# Patient Record
Sex: Female | Born: 1940 | Race: White | Hispanic: No | Marital: Married | Smoking: Former smoker
Health system: Southern US, Community
[De-identification: ages and names within clinical notes are randomized; demographics above are authoritative.]

## PROBLEM LIST (undated history)

## (undated) DIAGNOSIS — I509 Heart failure, unspecified: Secondary | ICD-10-CM

## (undated) DIAGNOSIS — I639 Cerebral infarction, unspecified: Secondary | ICD-10-CM

## (undated) DIAGNOSIS — G931 Anoxic brain damage, not elsewhere classified: Secondary | ICD-10-CM

## (undated) DIAGNOSIS — I214 Non-ST elevation (NSTEMI) myocardial infarction: Secondary | ICD-10-CM

## (undated) DIAGNOSIS — I10 Essential (primary) hypertension: Secondary | ICD-10-CM

## (undated) DIAGNOSIS — E7800 Pure hypercholesterolemia, unspecified: Secondary | ICD-10-CM

## (undated) DIAGNOSIS — E119 Type 2 diabetes mellitus without complications: Secondary | ICD-10-CM

## (undated) DIAGNOSIS — N289 Disorder of kidney and ureter, unspecified: Secondary | ICD-10-CM

## (undated) HISTORY — PX: ABDOMINAL HYSTERECTOMY: SHX81

## (undated) HISTORY — DX: Essential (primary) hypertension: I10

## (undated) HISTORY — DX: Disorder of kidney and ureter, unspecified: N28.9

## (undated) HISTORY — DX: Anoxic brain damage, not elsewhere classified: G93.1

## (undated) HISTORY — DX: Heart failure, unspecified: I50.9

## (undated) HISTORY — DX: Cerebral infarction, unspecified: I63.9

---

## 2008-08-25 ENCOUNTER — Emergency Department (HOSPITAL_COMMUNITY): Admission: EM | Admit: 2008-08-25 | Discharge: 2008-08-26 | Payer: Self-pay | Admitting: Emergency Medicine

## 2010-04-09 LAB — URINALYSIS, ROUTINE W REFLEX MICROSCOPIC
Bilirubin Urine: NEGATIVE
Hgb urine dipstick: NEGATIVE
Ketones, ur: NEGATIVE mg/dL
Nitrite: NEGATIVE
Protein, ur: >300 mg/dL — AB
Specific Gravity, Urine: 1.009 (ref 1.005–1.030)
Urobilinogen, UA: 0.2 mg/dL (ref 0.0–1.0)

## 2010-04-09 LAB — CBC
HCT: 34.6 % — ABNORMAL LOW (ref 36.0–46.0)
Hemoglobin: 11.6 g/dL — ABNORMAL LOW (ref 12.0–15.0)
MCHC: 33.7 g/dL (ref 30.0–36.0)
MCV: 90.5 fL (ref 78.0–100.0)
RBC: 3.82 MIL/uL — ABNORMAL LOW (ref 3.87–5.11)
RDW: 14.6 % (ref 11.5–15.5)

## 2010-04-09 LAB — DIFFERENTIAL
Basophils Absolute: 0.1 10*3/uL (ref 0.0–0.1)
Basophils Relative: 1 % (ref 0–1)
Eosinophils Absolute: 0.3 10*3/uL (ref 0.0–0.7)
Eosinophils Relative: 4 % (ref 0–5)
Monocytes Absolute: 0.5 10*3/uL (ref 0.1–1.0)
Monocytes Relative: 6 % (ref 3–12)
Neutro Abs: 4.5 10*3/uL (ref 1.7–7.7)

## 2010-04-09 LAB — POCT I-STAT, CHEM 8
BUN: 24 mg/dL — ABNORMAL HIGH (ref 6–23)
Creatinine, Ser: 1.3 mg/dL — ABNORMAL HIGH (ref 0.4–1.2)
Glucose, Bld: 166 mg/dL — ABNORMAL HIGH (ref 70–99)
Hemoglobin: 11.6 g/dL — ABNORMAL LOW (ref 12.0–15.0)
Potassium: 4.5 mEq/L (ref 3.5–5.1)
Sodium: 139 mEq/L (ref 135–145)

## 2010-04-09 LAB — URINE MICROSCOPIC-ADD ON

## 2010-05-17 NOTE — Consult Note (Signed)
NAME:  Rachel Curry, Rachel Curry NO.:  [id number]   MEDICAL RECORD NO.:  [id number]          PATIENT TYPE:  EMS   LOCATION:  [facility]                         FACILITY:  [facility]   PHYSICIAN:  Gaspar Garbe, M.D.DATE OF BIRTH:  September 21, 1940   DATE OF CONSULTATION:  08/26/2008  DATE OF DISCHARGE:  08/26/2008                                 CONSULTATION   CHIEF COMPLAINT:  Left-sided weakness.   HISTORY OF PRESENT ILLNESS:  The patient is a 70 year old white female  who is well known to me from primary care and I have been taking care of  her for the past 8 years.  She has had an anoxic brain injury since 1989  following a cardiac arrest as an left in a relatively childlike state,  goes to adult day care and has taken care of her husband who attends her  at all of her visits.  He has been very supportive of her over the past  20 years and indicates that he has no desire to put her to any nursing  facility whatsoever until his health fails, nothing he cannot take care  of her.  In addition, she has diabetes mellitus, which has been well  controlled for the past 6 years on insulin-based therapy, hypertension,  and hyperlipidemia.   The patient's husband dropped off a urine sample at our office  indicating that she had been acting funny over the course of the weekend  and is usually indicates urinary tract infection, her urine came back  negative and he called again Tuesday mid morning, indicating that she  was still acting little bit funny.  He also has noticed over the past  few days that she had been favoring her right side and not moving her  left side nearly as much.  Her walking and gait changed a little bit.  We instructed them to go over to the emergency room for evaluation as  showing on her childlike state would most likely need some sort of  sedation in order to proceed with imaging or workup of stroke.  The  patient was taken over there late in the evening about 5 p.m.,  it was  made the urgency that was noted to the husband initially.  He underwent  a CAT scan that was negative, urinalysis that was negative and has been  held in CDU overnight with a pending MRI, which was just completed at  the time of this dictation.  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  I was asked to see the patient for consideration of  admission versus outpatient treatment.   CURRENT MEDICATIONS:  1. Aspirin 325 mg once daily.  2. Lantus 85 units subcu at bedtime.  3. Glucophage XR 500 mg daily.  4. Lipitor 20 mg daily.  5. TriCor 145 mg daily.  6. Enalapril 20 mg daily.  7. Prilosec 20 mg daily.  8. Lorazepam 1 mg tablets 2-4 tablets as needed for agitation.  9. Ecotrin 325 mg  daily.  10.NovoLog by sliding scale.  11.70/30 NovoLog Mix 45 units in the mornings.  12.Bactrim 1 tablet every Monday, Wednesday, Friday for urinary tract      infection prophylaxis.   ALLERGIES:  The patient is allergic to [facility].   PAST MEDICAL HISTORY:  1. Diabetes mellitus type 2, last A1c was 6.7.  2. Hypertension.  3. Hypertriglyceridemia, currently on dual therapy.  4. History of coronary artery disease.  5. 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   SOCIAL HISTORY:  The patient is disabled.  She is a nonsmoker,  nondrinker and her husband, Shea Evans, takes care for full-time.   FAMILY HISTORY:  Not well known.   REVIEW OF SYSTEMS:  The patient was unable to give.  Per the husband,  she has strength back on her left side, but her gait has changed to some  extent.  She seems over the past several months to have been leaning a  little bit more towards the left.   PHYSICAL EXAMINATION:  VITAL SIGNS:  Temperature 98.2, pulse 82,  respiratory rate 18, blood pressure 126/72.  GENERAL:  No acute distress.  HEENT:  Normocephalic, atraumatic.  LUNGS:  Clear to auscultation  bilaterally.  HEART:  Regular rate and rhythm.  No murmur, rub, or gallop are  appreciated.  ABDOMEN:  Soft, nontender, normoactive bowel sounds.  NEUROLOGIC:  The patient is in childlike state, does not able to give  verbal understanding.  She will say good-bye and no, which is her  baseline and will nod usually in a negative fashion regardless of the  response.  She is able to move her left side upper body and has very  good grip strength of her left hand, 4/5 strength in her left upper  thigh.   IMAGING:  CAT scan of brain was negative.  MRI shows a right vertex  hemorrhagic stroke, which is small in nature.  There was no intracranial  shift per Radiology.   LABORATORY TESTING:  Urinalysis shows 0-2 white blood cells and rare  bacteria.  The urinalysis is negative for leukocyte nitrate, positive  for protein which is her baseline.  CBC shows a white count of 8.4,  hemoglobin 11.6, hematocrit 34.6, platelet count 254.  Chemistries show  a potassium of 4.5, sodium 139, BUN and creatinine 24 and 1.3  respectively which are her baseline.   ASSESSMENT AND PLAN:  Left-sided vertex cerebrovascular accident.  After  long discussion with the patient's husband, indicates that he does not  want her admitted to the hospital and he prefers to take care of her at  home.  He was otherwise able-bodied.  I get off from physical therapy,  occupational therapy for the patient; however, we know that she will not  be able to comply with a physical therapy regimen as she is in a very  childlike state and has been for the past 20 years.  I encouraged him to  try to exercise her at home and with adult day care as best that they  can.  In the meantime, set of carotid Dopplers on the patient.  She is  not a Coumadin candidate given a history of falls, and dance relative  inability to be able to get her in frequently for Coumadin checks as  needed.  She has seen in the office regularly every 3 months, but  more  than that would be not something that he wanted to do, this was  discussed with the patient's husband  and he agreed this was not the best  option.  We have decided therefore to discontinue her aspirin and start  her on Plavix 75 mg per day, the dose will be given in the emergency  room.  The patient will have  followup for carotid ultrasounds to rule out need for surgical  intervention.  She is not desirable however, this will be arranged for  her in the next 24 hours.  In addition, I will see her back in the  office in 1 week and provide a reassessment.      Gaspar Garbe, M.D.  Electronically Signed     RWT/MEDQ  D:  08/26/2008  T:  08/26/2008  Job:  [id number]

## 2013-05-02 ENCOUNTER — Ambulatory Visit (HOSPITAL_COMMUNITY)
Admission: RE | Admit: 2013-05-02 | Discharge: 2013-05-02 | Disposition: A | Discharge status: Home / Self Care | Payer: Medicare Other | Source: Ambulatory Visit | Attending: Surgery | Admitting: Surgery

## 2013-05-02 ENCOUNTER — Other Ambulatory Visit (HOSPITAL_COMMUNITY): Payer: Self-pay | Admitting: Internal Medicine

## 2013-05-02 DIAGNOSIS — M7989 Other specified soft tissue disorders: Secondary | ICD-10-CM

## 2014-10-04 ENCOUNTER — Emergency Department (HOSPITAL_COMMUNITY): Payer: Medicare Other

## 2014-10-04 ENCOUNTER — Inpatient Hospital Stay (HOSPITAL_COMMUNITY)
Admission: EM | Admit: 2014-10-04 | Discharge: 2014-10-08 | DRG: 291 | Disposition: A | Discharge status: Home Health | Payer: Medicare Other | Attending: Internal Medicine | Admitting: Internal Medicine

## 2014-10-04 ENCOUNTER — Inpatient Hospital Stay (HOSPITAL_COMMUNITY): Payer: Medicare Other

## 2014-10-04 ENCOUNTER — Encounter (HOSPITAL_COMMUNITY): Payer: Self-pay | Admitting: *Deleted

## 2014-10-04 DIAGNOSIS — G4733 Obstructive sleep apnea (adult) (pediatric): Secondary | ICD-10-CM | POA: Diagnosis present

## 2014-10-04 DIAGNOSIS — Z87891 Personal history of nicotine dependence: Secondary | ICD-10-CM | POA: Diagnosis not present

## 2014-10-04 DIAGNOSIS — J810 Acute pulmonary edema: Secondary | ICD-10-CM | POA: Diagnosis not present

## 2014-10-04 DIAGNOSIS — Z8673 Personal history of transient ischemic attack (TIA), and cerebral infarction without residual deficits: Secondary | ICD-10-CM | POA: Diagnosis not present

## 2014-10-04 DIAGNOSIS — J9601 Acute respiratory failure with hypoxia: Secondary | ICD-10-CM | POA: Diagnosis not present

## 2014-10-04 DIAGNOSIS — Z993 Dependence on wheelchair: Secondary | ICD-10-CM

## 2014-10-04 DIAGNOSIS — R29810 Facial weakness: Secondary | ICD-10-CM | POA: Diagnosis not present

## 2014-10-04 DIAGNOSIS — R9431 Abnormal electrocardiogram [ECG] [EKG]: Secondary | ICD-10-CM | POA: Diagnosis not present

## 2014-10-04 DIAGNOSIS — Z888 Allergy status to other drugs, medicaments and biological substances status: Secondary | ICD-10-CM

## 2014-10-04 DIAGNOSIS — I252 Old myocardial infarction: Secondary | ICD-10-CM | POA: Diagnosis not present

## 2014-10-04 DIAGNOSIS — J811 Chronic pulmonary edema: Secondary | ICD-10-CM | POA: Diagnosis present

## 2014-10-04 DIAGNOSIS — N184 Chronic kidney disease, stage 4 (severe): Secondary | ICD-10-CM | POA: Diagnosis present

## 2014-10-04 DIAGNOSIS — E7800 Pure hypercholesterolemia, unspecified: Secondary | ICD-10-CM | POA: Diagnosis present

## 2014-10-04 DIAGNOSIS — Z881 Allergy status to other antibiotic agents status: Secondary | ICD-10-CM | POA: Diagnosis not present

## 2014-10-04 DIAGNOSIS — Z7902 Long term (current) use of antithrombotics/antiplatelets: Secondary | ICD-10-CM | POA: Diagnosis not present

## 2014-10-04 DIAGNOSIS — I130 Hypertensive heart and chronic kidney disease with heart failure and stage 1 through stage 4 chronic kidney disease, or unspecified chronic kidney disease: Secondary | ICD-10-CM | POA: Diagnosis present

## 2014-10-04 DIAGNOSIS — I2510 Atherosclerotic heart disease of native coronary artery without angina pectoris: Secondary | ICD-10-CM | POA: Diagnosis present

## 2014-10-04 DIAGNOSIS — E870 Hyperosmolality and hypernatremia: Secondary | ICD-10-CM | POA: Diagnosis present

## 2014-10-04 DIAGNOSIS — G931 Anoxic brain damage, not elsewhere classified: Secondary | ICD-10-CM | POA: Diagnosis present

## 2014-10-04 DIAGNOSIS — N189 Chronic kidney disease, unspecified: Secondary | ICD-10-CM | POA: Diagnosis not present

## 2014-10-04 DIAGNOSIS — S14123S Central cord syndrome at C3 level of cervical spinal cord, sequela: Secondary | ICD-10-CM

## 2014-10-04 DIAGNOSIS — I5031 Acute diastolic (congestive) heart failure: Secondary | ICD-10-CM

## 2014-10-04 DIAGNOSIS — D631 Anemia in chronic kidney disease: Secondary | ICD-10-CM | POA: Diagnosis present

## 2014-10-04 DIAGNOSIS — J90 Pleural effusion, not elsewhere classified: Secondary | ICD-10-CM | POA: Diagnosis present

## 2014-10-04 DIAGNOSIS — E872 Acidosis: Secondary | ICD-10-CM | POA: Diagnosis present

## 2014-10-04 DIAGNOSIS — E1122 Type 2 diabetes mellitus with diabetic chronic kidney disease: Secondary | ICD-10-CM | POA: Diagnosis present

## 2014-10-04 DIAGNOSIS — Z794 Long term (current) use of insulin: Secondary | ICD-10-CM

## 2014-10-04 DIAGNOSIS — N390 Urinary tract infection, site not specified: Secondary | ICD-10-CM | POA: Diagnosis present

## 2014-10-04 DIAGNOSIS — I5021 Acute systolic (congestive) heart failure: Secondary | ICD-10-CM | POA: Diagnosis present

## 2014-10-04 DIAGNOSIS — E785 Hyperlipidemia, unspecified: Secondary | ICD-10-CM | POA: Diagnosis present

## 2014-10-04 DIAGNOSIS — Z79899 Other long term (current) drug therapy: Secondary | ICD-10-CM | POA: Diagnosis not present

## 2014-10-04 DIAGNOSIS — N179 Acute kidney failure, unspecified: Secondary | ICD-10-CM | POA: Diagnosis present

## 2014-10-04 DIAGNOSIS — I509 Heart failure, unspecified: Secondary | ICD-10-CM | POA: Diagnosis not present

## 2014-10-04 DIAGNOSIS — N170 Acute kidney failure with tubular necrosis: Secondary | ICD-10-CM

## 2014-10-04 DIAGNOSIS — D649 Anemia, unspecified: Secondary | ICD-10-CM

## 2014-10-04 DIAGNOSIS — I10 Essential (primary) hypertension: Secondary | ICD-10-CM | POA: Diagnosis present

## 2014-10-04 DIAGNOSIS — R1310 Dysphagia, unspecified: Secondary | ICD-10-CM

## 2014-10-04 DIAGNOSIS — E871 Hypo-osmolality and hyponatremia: Secondary | ICD-10-CM | POA: Diagnosis present

## 2014-10-04 DIAGNOSIS — S14123A Central cord syndrome at C3 level of cervical spinal cord, initial encounter: Secondary | ICD-10-CM | POA: Diagnosis present

## 2014-10-04 DIAGNOSIS — E1129 Type 2 diabetes mellitus with other diabetic kidney complication: Secondary | ICD-10-CM | POA: Diagnosis present

## 2014-10-04 HISTORY — DX: Pure hypercholesterolemia, unspecified: E78.00

## 2014-10-04 HISTORY — DX: Type 2 diabetes mellitus without complications: E11.9

## 2014-10-04 HISTORY — DX: Non-ST elevation (NSTEMI) myocardial infarction: I21.4

## 2014-10-04 LAB — CBC WITH DIFFERENTIAL/PLATELET
Basophils Absolute: 0 10*3/uL (ref 0.0–0.1)
Basophils Relative: 0 %
EOS PCT: 3 %
Eosinophils Absolute: 0.3 10*3/uL (ref 0.0–0.7)
HEMATOCRIT: 26.3 % — AB (ref 36.0–46.0)
HEMOGLOBIN: 8.1 g/dL — AB (ref 12.0–15.0)
Lymphocytes Relative: 22 %
Lymphs Abs: 2.1 10*3/uL (ref 0.7–4.0)
MCH: 31.3 pg (ref 26.0–34.0)
MCHC: 30.8 g/dL (ref 30.0–36.0)
MCV: 101.5 fL — ABNORMAL HIGH (ref 78.0–100.0)
MONO ABS: 0.5 10*3/uL (ref 0.1–1.0)
MONOS PCT: 5 %
Neutro Abs: 6.9 10*3/uL (ref 1.7–7.7)
Neutrophils Relative %: 70 %
Platelets: 287 10*3/uL (ref 150–400)
RBC: 2.59 MIL/uL — AB (ref 3.87–5.11)
RDW: 14.3 % (ref 11.5–15.5)
WBC: 9.7 10*3/uL (ref 4.0–10.5)

## 2014-10-04 LAB — BASIC METABOLIC PANEL
ANION GAP: 10 (ref 5–15)
Anion gap: 9 (ref 5–15)
BUN: 32 mg/dL — AB (ref 6–20)
BUN: 32 mg/dL — ABNORMAL HIGH (ref 6–20)
CALCIUM: 8.3 mg/dL — AB (ref 8.9–10.3)
CALCIUM: 8.6 mg/dL — AB (ref 8.9–10.3)
CHLORIDE: 110 mmol/L (ref 101–111)
CO2: 18 mmol/L — AB (ref 22–32)
CO2: 18 mmol/L — AB (ref 22–32)
Chloride: 106 mmol/L (ref 101–111)
Creatinine, Ser: 2.91 mg/dL — ABNORMAL HIGH (ref 0.44–1.00)
Creatinine, Ser: 2.99 mg/dL — ABNORMAL HIGH (ref 0.44–1.00)
GFR calc Af Amer: 17 mL/min — ABNORMAL LOW (ref >60)
GFR calc non Af Amer: 15 mL/min — ABNORMAL LOW (ref >60)
GFR, EST AFRICAN AMERICAN: 17 mL/min — AB (ref >60)
GFR, EST NON AFRICAN AMERICAN: 15 mL/min — AB (ref >60)
GLUCOSE: 149 mg/dL — AB (ref 65–99)
GLUCOSE: 137 mg/dL — AB (ref 65–99)
POTASSIUM: 4.9 mmol/L (ref 3.5–5.1)
Potassium: 5.1 mmol/L (ref 3.5–5.1)
Sodium: 133 mmol/L — ABNORMAL LOW (ref 135–145)
Sodium: 138 mmol/L (ref 135–145)

## 2014-10-04 LAB — URINALYSIS, ROUTINE W REFLEX MICROSCOPIC
Bilirubin Urine: NEGATIVE
GLUCOSE, UA: NEGATIVE mg/dL
Ketones, ur: NEGATIVE mg/dL
Nitrite: NEGATIVE
PH: 5.5 (ref 5.0–8.0)
Protein, ur: 100 mg/dL — AB
SPECIFIC GRAVITY, URINE: 1.007 (ref 1.005–1.030)
Urobilinogen, UA: 0.2 mg/dL (ref 0.0–1.0)

## 2014-10-04 LAB — GLUCOSE, CAPILLARY
Glucose-Capillary: 166 mg/dL — ABNORMAL HIGH (ref 65–99)
Glucose-Capillary: 166 mg/dL — ABNORMAL HIGH (ref 65–99)
Glucose-Capillary: 127 mg/dL — ABNORMAL HIGH (ref 65–99)

## 2014-10-04 LAB — URINE MICROSCOPIC-ADD ON

## 2014-10-04 LAB — TROPONIN I
TROPONIN I: 0.06 ng/mL — AB (ref <0.031)
TROPONIN I: 0.08 ng/mL — AB (ref <0.031)
Troponin I: 0.06 ng/mL — ABNORMAL HIGH (ref <0.031)

## 2014-10-04 LAB — POC OCCULT BLOOD, ED: Fecal Occult Bld: NEGATIVE

## 2014-10-04 LAB — BRAIN NATRIURETIC PEPTIDE: B NATRIURETIC PEPTIDE 5: 1501.8 pg/mL — AB (ref 0.0–100.0)

## 2014-10-04 MED ORDER — INSULIN GLARGINE 100 UNIT/ML ~~LOC~~ SOLN
7.0000 [IU] | Freq: Every day | SUBCUTANEOUS | Status: DC
  Administered 2014-10-05 – 2014-10-07 (×3): 7 [IU] via SUBCUTANEOUS
  Filled 2014-10-04 (×5): qty 0.07

## 2014-10-04 MED ORDER — SODIUM CHLORIDE 0.9 % IJ SOLN
3.0000 mL | Freq: Two times a day (BID) | INTRAMUSCULAR | Status: DC
  Administered 2014-10-04 – 2014-10-08 (×8): 3 mL via INTRAVENOUS

## 2014-10-04 MED ORDER — FENOFIBRATE 160 MG PO TABS
160.0000 mg | ORAL_TABLET | Freq: Every day | ORAL | Status: DC
  Administered 2014-10-04 – 2014-10-05 (×2): 160 mg via ORAL
  Filled 2014-10-04 (×2): qty 1

## 2014-10-04 MED ORDER — CLOPIDOGREL BISULFATE 75 MG PO TABS
75.0000 mg | ORAL_TABLET | Freq: Every day | ORAL | Status: DC
  Administered 2014-10-04 – 2014-10-08 (×5): 75 mg via ORAL
  Filled 2014-10-04 (×5): qty 1

## 2014-10-04 MED ORDER — LORAZEPAM 1 MG PO TABS
1.0000 mg | ORAL_TABLET | Freq: Two times a day (BID) | ORAL | Status: DC | PRN
  Administered 2014-10-04 – 2014-10-05 (×2): 1 mg via ORAL
  Filled 2014-10-04 (×2): qty 1

## 2014-10-04 MED ORDER — ALBUTEROL SULFATE (2.5 MG/3ML) 0.083% IN NEBU
5.0000 mg | INHALATION_SOLUTION | Freq: Once | RESPIRATORY_TRACT | Status: AC
  Administered 2014-10-04: 5 mg via RESPIRATORY_TRACT
  Filled 2014-10-04: qty 6

## 2014-10-04 MED ORDER — INFLUENZA VAC SPLIT QUAD 0.5 ML IM SUSY
0.5000 mL | PREFILLED_SYRINGE | INTRAMUSCULAR | Status: AC
  Administered 2014-10-05: 0.5 mL via INTRAMUSCULAR
  Filled 2014-10-04: qty 0.5

## 2014-10-04 MED ORDER — FUROSEMIDE 10 MG/ML IJ SOLN
80.0000 mg | Freq: Two times a day (BID) | INTRAMUSCULAR | Status: DC
  Administered 2014-10-04 – 2014-10-06 (×4): 80 mg via INTRAVENOUS
  Filled 2014-10-04 (×4): qty 8

## 2014-10-04 MED ORDER — DEXTROSE 5 % IV SOLN
1.0000 g | INTRAVENOUS | Status: DC
  Administered 2014-10-04 – 2014-10-06 (×3): 1 g via INTRAVENOUS
  Filled 2014-10-04 (×3): qty 10

## 2014-10-04 MED ORDER — INSULIN ASPART 100 UNIT/ML ~~LOC~~ SOLN
0.0000 [IU] | Freq: Every day | SUBCUTANEOUS | Status: DC
  Administered 2014-10-06 – 2014-10-07 (×2): 2 [IU] via SUBCUTANEOUS

## 2014-10-04 MED ORDER — SODIUM CHLORIDE 0.9 % IJ SOLN: 3.0000 mL | INTRAMUSCULAR | Status: DC | PRN

## 2014-10-04 MED ORDER — IPRATROPIUM BROMIDE 0.02 % IN SOLN
0.5000 mg | Freq: Once | RESPIRATORY_TRACT | Status: AC
  Administered 2014-10-04: 0.5 mg via RESPIRATORY_TRACT
  Filled 2014-10-04: qty 2.5

## 2014-10-04 MED ORDER — ACETAMINOPHEN 325 MG PO TABS: 650.0000 mg | ORAL_TABLET | ORAL | Status: DC | PRN

## 2014-10-04 MED ORDER — FUROSEMIDE 10 MG/ML IJ SOLN
40.0000 mg | Freq: Once | INTRAMUSCULAR | Status: AC
  Administered 2014-10-04: 40 mg via INTRAVENOUS
  Filled 2014-10-04: qty 4

## 2014-10-04 MED ORDER — FUROSEMIDE 10 MG/ML IJ SOLN
40.0000 mg | INTRAMUSCULAR | Status: AC
  Administered 2014-10-04: 40 mg via INTRAVENOUS
  Filled 2014-10-04: qty 4

## 2014-10-04 MED ORDER — ENALAPRIL MALEATE 5 MG PO TABS
20.0000 mg | ORAL_TABLET | Freq: Every day | ORAL | Status: DC
  Administered 2014-10-04 – 2014-10-05 (×2): 20 mg via ORAL
  Filled 2014-10-04 (×2): qty 4

## 2014-10-04 MED ORDER — INSULIN GLARGINE 100 UNIT/ML ~~LOC~~ SOLN
7.0000 [IU] | Freq: Every day | SUBCUTANEOUS | Status: DC
  Filled 2014-10-04: qty 0.07

## 2014-10-04 MED ORDER — INSULIN ASPART 100 UNIT/ML ~~LOC~~ SOLN
0.0000 [IU] | Freq: Three times a day (TID) | SUBCUTANEOUS | Status: DC
  Administered 2014-10-04 (×2): 2 [IU] via SUBCUTANEOUS
  Administered 2014-10-05: 3 [IU] via SUBCUTANEOUS
  Administered 2014-10-05 (×2): 1 [IU] via SUBCUTANEOUS
  Administered 2014-10-06: 2 [IU] via SUBCUTANEOUS
  Administered 2014-10-06: 1 [IU] via SUBCUTANEOUS
  Administered 2014-10-07: 3 [IU] via SUBCUTANEOUS
  Administered 2014-10-07: 2 [IU] via SUBCUTANEOUS
  Administered 2014-10-07: 3 [IU] via SUBCUTANEOUS
  Administered 2014-10-08: 5 [IU] via SUBCUTANEOUS
  Administered 2014-10-08: 2 [IU] via SUBCUTANEOUS

## 2014-10-04 MED ORDER — ENOXAPARIN SODIUM 30 MG/0.3ML ~~LOC~~ SOLN
30.0000 mg | SUBCUTANEOUS | Status: DC
  Administered 2014-10-04 – 2014-10-07 (×4): 30 mg via SUBCUTANEOUS
  Filled 2014-10-04 (×4): qty 0.3

## 2014-10-04 MED ORDER — ATORVASTATIN CALCIUM 20 MG PO TABS
20.0000 mg | ORAL_TABLET | Freq: Every day | ORAL | Status: DC
  Administered 2014-10-04 – 2014-10-07 (×4): 20 mg via ORAL
  Filled 2014-10-04 (×4): qty 1

## 2014-10-04 MED ORDER — SODIUM CHLORIDE 0.9 % IV SOLN: 250.0000 mL | INTRAVENOUS | Status: DC | PRN

## 2014-10-04 MED ORDER — ALBUTEROL SULFATE (2.5 MG/3ML) 0.083% IN NEBU
2.5000 mg | INHALATION_SOLUTION | Freq: Four times a day (QID) | RESPIRATORY_TRACT | Status: DC | PRN
Start: 2014-10-04 — End: 2014-10-05
  Administered 2014-10-04: 2.5 mg via RESPIRATORY_TRACT
  Filled 2014-10-04: qty 3

## 2014-10-04 MED ORDER — ONDANSETRON HCL 4 MG/2ML IJ SOLN: 4.0000 mg | Freq: Four times a day (QID) | INTRAMUSCULAR | Status: DC | PRN

## 2014-10-04 NOTE — H&P (Signed)
[facility] History and Physical                                                                                    Rachel Curry, is a 74 y.o. female  MRN: [id number]   DOB - 02-Aug-1940  Admit Date - 10/04/2014  Outpatient Primary MD for the patient is Haywood Pao, MD  Referring MD: Doy Mince / ER  With History of -  Past Medical History  Diagnosis Date  . Diabetes mellitus without complication ([place])   . Hypercholesteremia   . MI, acute, non ST segment elevation ([place])       History reviewed. No pertinent past surgical history.  in for   Chief Complaint  Patient presents with  . Shortness of Breath     HPI This is a 74 year old female patient with history of CAD and MI 27 years ago that apparently resulted in cardiogenic shock and anoxic brain injury, diabetes on insulin, dyslipidemia, hypertension who presents to the ER with shortness of breath. According to the family the patient has had a progressive decline of the past 3-4 weeks with weight loss and anorexia and she stopped feeding herself. The past 2-3 days she's had increasing respiratory symptoms primarily with wheezing and what appears to be orthopnea. At baseline she would walk with assistance but has been refusing to do so and when she is up and is unable to walk as far as she had been able to. According to the family during this time. The patient has been evaluated by primary care physician and it was noted that her renal function had worsened and she had some hyperkalemia. Apparently medications were adjusted and primary care physician spoke with the husband about wishes regarding dialysis and both agree dialysis would be inappropriate for this patient. The cause of her underlying anoxic brain injury the patient is unable to answer questions. She is not had any issues with nausea vomiting diarrhea any apparent abdominal pain any apparent chest pain or headache, no skin changes or decubiti, no change in  urination.  Upon arrival to the ER she was found to be hypertensive with a blood pressure of 172/77, heart rate 89, O2 saturations were 91-93% on room air and she is subsequently been placed on nasal cannula oxygen, she was not tachypneic, portable chest x-ray revealed mild cardiomegaly and pulmonary interstitial edema with a moderate left pleural effusion and a small right pleural effusion. Laboratory data reveal hyponatremia sodium 629, metabolic acidemia at the CO2 of 18, acute renal failure with a BUN of 32 and creatinine 2.91 with baseline renal function unknown although in 2010 patient's urine was 24 and creatinine was 1.3. BNP elevated at 1502, hemoglobin low at 8.1 with macrocytosis MCV 101.5, baseline hemoglobin unknown. No leukocytosis and platelets are normal, slightly elevated at 149. Urinalysis has been obtained and is abnormal and appears consistent with a UTI with cloudy appearance, few bacteria, moderate leukocytes, protein 100, few squamous epithelials and, in 11-20 WBCs. Urine specific gravity borderline low at 1.007. Fecal occult blood was negative.   Review of Systems   Unable to obtain from patient and history of present illness obtained from family  Social History Social History  Substance Use Topics  . Smoking status: Never Smoker   . Smokeless tobacco: Not on file  . Alcohol Use: No    Resides at: Private residence  Lives with: Spouse  Ambulatory status: Mostly bed and wheelchair-bound although at baseline prior to this illness was able to ambulate with assistance   Family History History reviewed. No significant family history obtained after interviewing the patient's husband   Prior to Admission medications   Medication Sig Start Date End Date Taking? Authorizing Provider  atorvastatin (LIPITOR) 20 MG tablet Take 20 mg by mouth daily. 07/15/14  Yes Historical Provider, MD  clopidogrel (PLAVIX) 75 MG tablet Take 75 mg by mouth daily. 07/15/14  Yes Historical  Provider, MD  enalapril (VASOTEC) 20 MG tablet Take 20 mg by mouth daily. 07/15/14  Yes Historical Provider, MD  fenofibrate (TRICOR) 145 MG tablet Take 145 mg by mouth daily. 07/15/14  Yes Historical Provider, MD  insulin aspart (NOVOLOG) 100 UNIT/ML injection Inject 10 Units into the skin every evening. Only when CBG over 130   Yes Historical Provider, MD  insulin glargine (LANTUS) 100 UNIT/ML injection Inject 14 Units into the skin at bedtime.   Yes Historical Provider, MD  insulin lispro protamine-lispro (HUMALOG 75/25 MIX) (75-25) 100 UNIT/ML SUSP injection Inject 20 Units into the skin daily with breakfast.   Yes Historical Provider, MD  LORazepam (ATIVAN) 1 MG tablet Take 1 mg by mouth 2 (two) times daily as needed. anxiety 08/07/14  Yes Historical Provider, MD  omeprazole (PRILOSEC) 20 MG capsule Take 20 mg by mouth daily.   Yes Historical Provider, MD  sulfamethoxazole-trimethoprim (BACTRIM DS,SEPTRA DS) 800-160 MG tablet Take 1 tablet by mouth every Monday, Wednesday, and Friday. 07/15/14  Yes Historical Provider, MD    Allergies  Allergen Reactions  . Azithromycin Diarrhea  . Macrodantin [Nitrofurantoin Macrocrystal]     sob    Physical Exam  Vitals  Blood pressure 177/64, pulse 97, temperature 97.8 F (36.6 C), temperature source Oral, resp. rate 18, height 5' (1.524 m), weight 160 lb 12.8 oz (72.938 kg), SpO2 96 %.   General:  In mild distress as evidenced by blowing respiratory effort, appears chronically ill and mildly toxic  Psych:  Flat affect, essentially noncommunicative although occasionally will have verbal responses that are inappropriate to questions asked, becomes easily agitated but able to be calmed by husband  Neuro:   No focal neurological deficits-patient with known anoxic brain injury, able to move all extremities 4 weekly but has difficulty following commands  ENT:  Ears and Eyes appear Normal, Conjunctivae clear, PER. Moist oral mucosa without erythema or  exudates.  Neck:  Supple, No lymphadenopathy appreciated  Respiratory:  Symmetrical chest wall movement, Good air movement bilaterally, these crackles anteriorly, 2 L oxygen  Cardiac:  RRR, No Murmurs, 2-3+ tight pitting bilateral LE edema noted, 6 cm JVD, No carotid bruits, peripheral pulses palpable at 2+  Abdomen:  Positive bowel sounds, Soft, Non tender, Non distended,  No masses appreciated, no obvious hepatosplenomegaly  Skin:  No Cyanosis, Normal Skin Turgor, No Skin Rash or Bruise. pale and grayish in appearance  Extremities: Symmetrical without obvious trauma or injury,  no effusions.  Data Review  CBC  Recent Labs Lab 10/04/14 0630  WBC 9.7  HGB 8.1*  HCT 26.3*  PLT 287  MCV 101.5*  MCH 31.3  MCHC 30.8  RDW 14.3  LYMPHSABS 2.1  MONOABS 0.5  EOSABS 0.3  BASOSABS 0.0  Chemistries   Recent Labs Lab 10/04/14 0630  NA 133*  K 5.1  CL 106  CO2 18*  GLUCOSE 149*  BUN 32*  CREATININE 2.91*  CALCIUM 8.3*    estimated creatinine clearance is 15.4 mL/min (by C-G formula based on Cr of 2.91).  No results for input(s): TSH, T4TOTAL, T3FREE, THYROIDAB in the last 72 hours.  Invalid input(s): FREET3  Coagulation profile No results for input(s): INR, PROTIME in the last 168 hours.  No results for input(s): DDIMER in the last 72 hours.  Cardiac Enzymes  Recent Labs Lab 10/04/14 0952  TROPONINI 0.06*    Invalid input(s): POCBNP  Urinalysis    Component Value Date/Time   COLORURINE YELLOW 10/04/2014 0940   APPEARANCEUR CLOUDY* 10/04/2014 0940   LABSPEC 1.007 10/04/2014 0940   PHURINE 5.5 10/04/2014 0940   GLUCOSEU NEGATIVE 10/04/2014 0940   HGBUR TRACE* 10/04/2014 0940   BILIRUBINUR NEGATIVE 10/04/2014 0940   KETONESUR NEGATIVE 10/04/2014 0940   PROTEINUR 100* 10/04/2014 0940   UROBILINOGEN 0.2 10/04/2014 0940   NITRITE NEGATIVE 10/04/2014 0940   LEUKOCYTESUR MODERATE* 10/04/2014 0940    Imaging results:   Dg Chest Portable 1  View  10/04/2014   CLINICAL DATA:  Shortness of breath for few days, worsening this evening. History of stroke, diabetes, myocardial infarction.  EXAM: PORTABLE CHEST 1 VIEW  COMPARISON:  None.  FINDINGS: The cardiac silhouette is mildly enlarged. Calcified aortic knob. Pulmonary vascular congestion and mild interstitial prominence. Small RIGHT, moderate LEFT pleural effusion, underlying patchy airspace opacity. No pneumothorax. Soft tissue planes and included osseous structure nonsuspicious.  IMPRESSION: Mild cardiomegaly and pulmonary interstitial edema. Moderate LEFT, small RIGHT pleural effusion underlying probable atelectasis or, confluent edema.   Electronically Signed   By: Elon Alas M.D.   On: 10/04/2014 06:37     EKG: (Independently reviewed) sinus rhythm with ventricular rate 89 bpm, QTC 454 ms, downsloping ST segments with T-wave inversions in leads V4 through V6 as well as somewhat in leads 2 and aVF and leads 1-no old EKG for comparison in patient with known history of prior MI   Assessment & Plan  Principal Problem: Acute respiratory failure with hypoxia ([place]):   A) Acute pulmonary edema (HCC)   B) Acute heart failure (HCC)   C) Bilateral pleural effusion -Admit to telemetry -Has had limited response to Lasix 40 mg IV given in ER and given low GFR given an additional 40 now and increase Lasix to 80 mg IV every 12 hours -Check echocardiogram -Repeat chest x-ray in a.m. -Continue ACE inhibitor for now-if renal function worsens may need to discontinue -No beta blocker secondary to acute decompensation as well as unknown subtype of heart failure -If pleural effusion especially on left persists and does not respond to Lasix may require evaluation for ultrasound-guided thoracentesis -Unclear if edema related to heart failure or related to progressive renal dysfunction  Active Problems:   Acute renal failure (HCC) -Baseline renal function unknown but at least in the past 3-4  weeks patient has been evaluated by primary care physician with known acute renal failure and hypokalemia with apparent medication adjustments -Hopeful decompensated renal function is related to heart failure and if able to diurese patient adequately we will improve renal function -Based on current medication reconciliation patient was on PPI and Monday Wednesday Friday Bactrim and in setting of acute renal failure we will discontinue these medications -Potassium 5.1 at admission-repeat be met today at 1400 after diuresis -check renal US  Acute hyponatremia -Likely combination of acute renal failure and volume overload -Follow labs    Diabetes mellitus, insulin dependent (IDDM), controlled (HCC) -Continue Lantus but decreased dose from 14 units to 7 units since we are keeping patient nothing by mouth until she can be reevaluated by speech therapy -Check CBGs and provide SSI -Check hemoglobin A1c    Abnormal EKG/CAD/History of MI  -Unclear if EKG changes are old or new -Have requested troponin at time of my evaluation and has returned slightly elevated at 0.06 so we will cycle -Troponin can be elevated in acute renal failure and heart failure as well -Patient unable to tell us if she's having symptoms    Anoxic brain damage ([place]) -Onset 27 years ago when at baseline if food was chopped for patient and placed in front of her she was able to feed herself and she was able to ambulate with assistance all of which has changed and she has become less " independent" since onset of acute illness -She is also had more trouble eating and swallowing so have made her nothing by mouth except for meds and a few ice chips and plan speech therapy evaluate    Anemia -Baseline hemoglobin unknown -Check TSH and anemia panel    HLD (hyperlipidemia) -Continue preadmission medications    HTN (hypertension) -Continue preadmission medications    UTI (urinary tract infection) -Abnormal urinalysis  obtained by catheterization specimen and appears consistent with UTI -Urine culture -Empiric Rocephin    DVT Prophylaxis: Lovenox dose adjusted for decreased GFR  Family Communication:   Husband and sister at bedside  Code Status:  Full code-I did discuss at length with husband concerns regarding efficacy of resuscitating this patient  Condition:  Guarded  Discharge disposition: Anticipate discharge back to home environment pending resolution or stabilization of  current problems  Time spent in minutes : 60      ELLIS,ALLISON L. ANP on 10/04/2014 at 10:59 AM  Between 7am to 7pm - Pager - [phone]  After 7pm go to [email or web address] - password [email or web address]  And look for the night coverage person covering me after hours  [facility]

## 2014-10-04 NOTE — ED Notes (Addendum)
Pt to ED from husband c/o increased shortness of breath and restlessness. Pt at baseline, is nonverbal from an anoxic brain injury 20 years ago.Rachel Curry Hx of CVAs, Pt is wheelchair bound at home. Family reports pt is more lethargic and weaker over the past 3 weeks. Expiratory Wheezing noted

## 2014-10-04 NOTE — ED Notes (Signed)
Miller MD at bedside. 

## 2014-10-04 NOTE — Progress Notes (Signed)
Pt aspirated on her dinner.  VSS, set up suction and suctioned out pt mouth.  Dr. Susie Cassette notified and orders received to keep pt NPO for SLP eval tomorrow.  Will continue to closely monitor.

## 2014-10-04 NOTE — ED Provider Notes (Signed)
CSN: [id number]     Arrival date & time 10/04/14  7846 History   First MD Initiated Contact with Patient 10/04/14 [phone]     Chief Complaint  Patient presents with  . Shortness of Breath   Level V Caveat: Non-verbal patient.   Rachel Curry is a 74 y.o. female with a history of diabetes, MI, CVA who is essentially non-verbal and former smoker who presents to the ED with her husband reports the patient has had worsening shortness of breath, wheezing, cough and generalized weakness over the past 3 weeks. He reports the patient is a chronic cough that is worsened over the past 3 weeks. He reports her primary care provider called in azithromycin pack on 09/17/2014 without relief. They report that she has been wheezing and having decreased appetite. They report she still is been eating and drinking. Therefore the patient is wheelchair-bound at home and is not able to complete her activities of daily living on her own. Therefore the patient typically answers all questions with "no." They deny any vomiting, diarrhea, bloody stool, rashes, syncope, changes to her urination, hemoptysis.   (Consider location/radiation/quality/duration/timing/severity/associated sxs/prior Treatment) HPI  Past Medical History  Diagnosis Date  . Diabetes mellitus without complication (HCC)   . Hypercholesteremia   . MI, acute, non ST segment elevation (HCC)    History reviewed. No pertinent past surgical history. History reviewed. No pertinent family history. Social History  Substance Use Topics  . Smoking status: Never Smoker   . Smokeless tobacco: None  . Alcohol Use: No   OB History    No data available     Review of Systems  Unable to perform ROS: Patient nonverbal      Allergies  Azithromycin and Macrodantin  Home Medications   Prior to Admission medications   Medication Sig Start Date End Date Taking? Authorizing Provider  atorvastatin (LIPITOR) 20 MG tablet Take 20 mg by mouth daily. 07/15/14  Yes  Historical Provider, MD  clopidogrel (PLAVIX) 75 MG tablet Take 75 mg by mouth daily. 07/15/14  Yes Historical Provider, MD  enalapril (VASOTEC) 20 MG tablet Take 20 mg by mouth daily. 07/15/14  Yes Historical Provider, MD  fenofibrate (TRICOR) 145 MG tablet Take 145 mg by mouth daily. 07/15/14  Yes Historical Provider, MD  insulin aspart (NOVOLOG) 100 UNIT/ML injection Inject 10 Units into the skin every evening. Only when CBG over 130   Yes Historical Provider, MD  insulin glargine (LANTUS) 100 UNIT/ML injection Inject 14 Units into the skin at bedtime.   Yes Historical Provider, MD  insulin lispro protamine-lispro (HUMALOG 75/25 MIX) (75-25) 100 UNIT/ML SUSP injection Inject 20 Units into the skin daily with breakfast.   Yes Historical Provider, MD  LORazepam (ATIVAN) 1 MG tablet Take 1 mg by mouth 2 (two) times daily as needed. anxiety 08/07/14  Yes Historical Provider, MD  omeprazole (PRILOSEC) 20 MG capsule Take 20 mg by mouth daily.   Yes Historical Provider, MD  sulfamethoxazole-trimethoprim (BACTRIM DS,SEPTRA DS) 800-160 MG tablet Take 1 tablet by mouth every Monday, Wednesday, and Friday. 07/15/14  Yes Historical Provider, MD   BP 158/72 mmHg  Pulse 97  Temp(Src) 97.9 F (36.6 C) (Oral)  Resp 19  SpO2 97% Physical Exam  Constitutional: She appears well-developed and well-nourished. No distress.  Nontoxic appearing. Patient is nonverbal.  HENT:  Head: Normocephalic and atraumatic.  Mouth/Throat: Oropharynx is clear and moist.  Mucous membranes are moist.  Eyes: Conjunctivae are normal. Pupils are equal, round, and reactive to  light. Right eye exhibits no discharge. Left eye exhibits no discharge.  Neck: Neck supple. No JVD present.  Cardiovascular: Normal rate, regular rhythm, normal heart sounds and intact distal pulses.  Exam reveals no gallop and no friction rub.   No murmur heard. Bilateral radial pulses are intact.  Pulmonary/Chest: Effort normal. No respiratory distress. She has  wheezes. She has rales.  Wheezes noted without auscultation. Patient has wheezes and rales diffusely. Sounds wet. Diminished in her bilateral bases. Slight increased work of breathing. Oxygen saturation is 100% on neb treatment.   Abdominal: Soft. Bowel sounds are normal. There is no tenderness. There is no guarding.  Genitourinary: Guaiac negative stool.  Rectal exam performed by me and chaperoned by nurse tech. Patient has no gross bloody stool. Anal sphincter tone is normal.  Musculoskeletal: She exhibits edema.  Mild bilateral lower extremity edema.  Patient is spontaneously moving all extremities in a coordinated fashion exhibiting good strength.   Lymphadenopathy:    She has no cervical adenopathy.  Neurological: She is alert. Coordination normal.  Patient is alert and answers all questions with "no."    Skin: Skin is warm and dry. No rash noted. She is not diaphoretic. No erythema. No pallor.  Psychiatric: She has a normal mood and affect. Her behavior is normal.  Nursing note and vitals reviewed.   ED Course  Procedures (including critical care time) Labs Review Labs Reviewed  BASIC METABOLIC PANEL - Abnormal; Notable for the following:    Sodium 133 (*)    CO2 18 (*)    Glucose, Bld 149 (*)    BUN 32 (*)    Creatinine, Ser 2.91 (*)    Calcium 8.3 (*)    GFR calc non Af Amer 15 (*)    GFR calc Af Amer 17 (*)    All other components within normal limits  CBC WITH DIFFERENTIAL/PLATELET - Abnormal; Notable for the following:    RBC 2.59 (*)    Hemoglobin 8.1 (*)    HCT 26.3 (*)    MCV 101.5 (*)    All other components within normal limits  BRAIN NATRIURETIC PEPTIDE - Abnormal; Notable for the following:    B Natriuretic Peptide 1501.8 (*)    All other components within normal limits  OCCULT BLOOD X 1 CARD TO LAB, STOOL  URINALYSIS, ROUTINE W REFLEX MICROSCOPIC (NOT AT [facility])  BASIC METABOLIC PANEL  TROPONIN I  TROPONIN I  TROPONIN I  POC OCCULT BLOOD, ED     Imaging Review Dg Chest Portable 1 View  10/04/2014   CLINICAL DATA:  Shortness of breath for few days, worsening this evening. History of stroke, diabetes, myocardial infarction.  EXAM: PORTABLE CHEST 1 VIEW  COMPARISON:  None.  FINDINGS: The cardiac silhouette is mildly enlarged. Calcified aortic knob. Pulmonary vascular congestion and mild interstitial prominence. Small RIGHT, moderate LEFT pleural effusion, underlying patchy airspace opacity. No pneumothorax. Soft tissue planes and included osseous structure nonsuspicious.  IMPRESSION: Mild cardiomegaly and pulmonary interstitial edema. Moderate LEFT, small RIGHT pleural effusion underlying probable atelectasis or, confluent edema.   Electronically Signed   By: Awilda Metro M.D.   On: 10/04/2014 06:37   I have personally reviewed and evaluated these images and lab results as part of my medical decision-making.   EKG Interpretation   Date/Time:  Sunday October 04 2014 05:34:49 EDT Ventricular Rate:  89 PR Interval:  155 QRS Duration: 100 QT Interval:  373 QTC Calculation: 454 R Axis:   25 Text Interpretation:  Sinus rhythm Abnormal R-wave progression, early  transition Abnormal T, consider ischemia, diffuse leads No significant  change was found Confirmed by [facility]  MD, TREY ([id number]) on 10/04/2014  6:22:16 AM      Filed Vitals:   10/04/14 0730 10/04/14 0800 10/04/14 0830 10/04/14 0900  BP: 147/66 166/55 179/62 158/72  Pulse: 102 99 103 97  Temp:      TempSrc:      Resp: SpO2: 93% 98% 97% 97%     MDM   Meds given in ED:  Medications  albuterol (PROVENTIL) (2.5 MG/3ML) 0.083% nebulizer solution 5 mg (5 mg Nebulization Given 10/04/14 0548)  albuterol (PROVENTIL) (2.5 MG/3ML) 0.083% nebulizer solution 5 mg (5 mg Nebulization Given 10/04/14 0649)  ipratropium (ATROVENT) nebulizer solution 0.5 mg (0.5 mg Nebulization Given 10/04/14 0649)  furosemide (LASIX) injection 40 mg (40 mg Intravenous Given 10/04/14 0829)   furosemide (LASIX) injection 40 mg (40 mg Intravenous Given 10/04/14 1001)    New Prescriptions   No medications on file    Final diagnoses:  Acute pulmonary edema (HCC)  AKI (acute kidney injury) (HCC)  Anemia, unspecified anemia type   This is a 74 y.o. female with a history of diabetes, MI, CVA who is essentially non-verbal and former smoker who presents to the ED with her husband reports the patient has had worsening shortness of breath, wheezing, cough and generalized weakness over the past 3 weeks. She states a course of azithromycin prescribed by her primary care provider on 09/17/2014 without relief. On exam the patient is afebrile nontoxic appearing. She has increased work of breathing with expiratory audible wheezes on exam. She has diffuse wheezing and crackles bilaterally. Her oxygen saturation is 100% on nebulization treatment. She has bilateral lower extremity edema. She is alert and answers all questions with "no." The patient received a second albuterol treatment with Atrovent and on reevaluation the patient appears much improved. She no longer has increased work of breathing and her oxygen saturation is 92% on room air. Her oxygen saturations 100% on 2 L via nasal cannula. Chest x-ray revealed mild cardiomegaly and pulmonary interstitial edema. There is also moderate left and small right pleural effusion. Patient's CBC is remarkable for an anemia with a hemoglobin of 8.1. BMP is remarkable for worsening kidney failure with a creatinine of 2.91. BNP is elevated at 1501. Hemoccult is negative. Urinalysis is pending. EKG shows sinus rhythm with T-wave abnormalities. Plan is for admission for pulmonary edema, acute kidney injury and anemia. The patient sees Dr. Salome Spotted at [facility]. Will consult [facility] for admission.  Consulted with NP Revonda Standard who accepted the patient for admission with Dr. Susie Cassette and requested tele bed temporary orders. The family is in agreement with  admission.   This patient was discussed with and evaluated by Dr. Hyacinth Meeker who agrees with assessment and plan.   Everlene Farrier, PA-C 10/04/14 1011  Rachel Hong, MD 10/06/14 [phone]

## 2014-10-04 NOTE — ED Notes (Signed)
Foley Catheter inserted by [facility] EMT. Pericare provided by [facility] EMT. Sterile Technique observed by [facility] Nursing student. Assistance provided by [place] RN. Urine returned, 10ml. Balloon inflated and secure. Tubing secured with leg strap. Time, Date, Location, and initials of RN and EMT placed on bag.

## 2014-10-04 NOTE — ED Notes (Signed)
Occult blood card collected by [facility]. Chaperoned by Apolinar Junes EMT

## 2014-10-04 NOTE — ED Provider Notes (Signed)
The patient is a 74 year old female, she presents with increased cough and wheezing after having respiratory symptoms this last several weeks, was given a prescription for Zithromax by her doctor, took the medication but did not improve. 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 On exam she does have pitting edema bilaterally, lungs are wheezy, decreased lung sounds at the bases.  X-ray reviewed, bilateral pleural effusions left greater than right, pulmonary edema present, labs reviewed, acute renal failure noted, will need admission to the hospital.   EKG Interpretation  Date/Time:  Sunday October 04 2014 05:34:49 EDT Ventricular Rate:  89 PR Interval:  155 QRS Duration: 100 QT Interval:  373 QTC Calculation: 454 R Axis:   25 Text Interpretation:  Sinus rhythm Abnormal R-wave progression, early transition Abnormal T, consider ischemia, diffuse leads No significant change was found Confirmed by [facility]  MD, TREY ([id number]) on 10/04/2014 6:22:16 AM        Medical screening examination/treatment/procedure(s) were conducted as a shared visit with non-physician practitioner(s) and myself.  I personally evaluated the patient during the encounter.  Clinical Impression:   Final diagnoses:  Acute pulmonary edema (HCC)  AKI (acute kidney injury) (HCC)  Anemia, unspecified anemia type         Eber Hong, MD 10/06/14 1746

## 2014-10-05 ENCOUNTER — Ambulatory Visit (HOSPITAL_COMMUNITY): Payer: Medicare Other

## 2014-10-05 ENCOUNTER — Inpatient Hospital Stay (HOSPITAL_COMMUNITY): Payer: Medicare Other

## 2014-10-05 DIAGNOSIS — E870 Hyperosmolality and hypernatremia: Secondary | ICD-10-CM | POA: Diagnosis not present

## 2014-10-05 DIAGNOSIS — I509 Heart failure, unspecified: Secondary | ICD-10-CM

## 2014-10-05 DIAGNOSIS — S14123A Central cord syndrome at C3 level of cervical spinal cord, initial encounter: Secondary | ICD-10-CM | POA: Diagnosis present

## 2014-10-05 DIAGNOSIS — N179 Acute kidney failure, unspecified: Secondary | ICD-10-CM

## 2014-10-05 DIAGNOSIS — I130 Hypertensive heart and chronic kidney disease with heart failure and stage 1 through stage 4 chronic kidney disease, or unspecified chronic kidney disease: Secondary | ICD-10-CM | POA: Diagnosis not present

## 2014-10-05 DIAGNOSIS — R1310 Dysphagia, unspecified: Secondary | ICD-10-CM

## 2014-10-05 LAB — BASIC METABOLIC PANEL
Anion gap: 10 (ref 5–15)
BUN: 33 mg/dL — AB (ref 6–20)
CHLORIDE: 108 mmol/L (ref 101–111)
CO2: 20 mmol/L — AB (ref 22–32)
CREATININE: 3.21 mg/dL — AB (ref 0.44–1.00)
Calcium: 8.7 mg/dL — ABNORMAL LOW (ref 8.9–10.3)
GFR calc Af Amer: 15 mL/min — ABNORMAL LOW (ref >60)
GFR calc non Af Amer: 13 mL/min — ABNORMAL LOW (ref >60)
GLUCOSE: 145 mg/dL — AB (ref 65–99)
Potassium: 4.9 mmol/L (ref 3.5–5.1)
SODIUM: 138 mmol/L (ref 135–145)

## 2014-10-05 LAB — GLUCOSE, CAPILLARY
GLUCOSE-CAPILLARY: 147 mg/dL — AB (ref 65–99)
GLUCOSE-CAPILLARY: 202 mg/dL — AB (ref 65–99)
GLUCOSE-CAPILLARY: 142 mg/dL — AB (ref 65–99)
GLUCOSE-CAPILLARY: 162 mg/dL — AB (ref 65–99)

## 2014-10-05 LAB — IRON AND TIBC
Iron: 51 ug/dL (ref 28–170)
SATURATION RATIOS: 12 % (ref 10.4–31.8)
TIBC: 412 ug/dL (ref 250–450)
UIBC: 361 ug/dL

## 2014-10-05 LAB — VITAMIN B12: Vitamin B-12: 589 pg/mL (ref 180–914)

## 2014-10-05 LAB — RETICULOCYTES
RBC.: 2.61 MIL/uL — ABNORMAL LOW (ref 3.87–5.11)
RETIC CT PCT: 2.1 % (ref 0.4–3.1)
Retic Count, Absolute: 54.8 10*3/uL (ref 19.0–186.0)

## 2014-10-05 LAB — HEMOGLOBIN A1C
Hgb A1c MFr Bld: 6.3 % — ABNORMAL HIGH (ref 4.8–5.6)
MEAN PLASMA GLUCOSE: 134 mg/dL

## 2014-10-05 LAB — FERRITIN: FERRITIN: 64 ng/mL (ref 11–307)

## 2014-10-05 LAB — FOLATE: Folate: 7.6 ng/mL (ref >5.9)

## 2014-10-05 LAB — TSH: TSH: 2.055 u[IU]/mL (ref 0.350–4.500)

## 2014-10-05 MED ORDER — DARBEPOETIN ALFA 40 MCG/0.4ML IJ SOSY
40.0000 ug | PREFILLED_SYRINGE | Freq: Once | INTRAMUSCULAR | Status: AC
  Administered 2014-10-05: 40 ug via SUBCUTANEOUS
  Filled 2014-10-05 (×2): qty 0.4

## 2014-10-05 MED ORDER — METOPROLOL TARTRATE 1 MG/ML IV SOLN
5.0000 mg | Freq: Four times a day (QID) | INTRAVENOUS | Status: DC
  Administered 2014-10-05 – 2014-10-06 (×5): 5 mg via INTRAVENOUS
  Filled 2014-10-05 (×5): qty 5

## 2014-10-05 MED ORDER — ALBUTEROL SULFATE (2.5 MG/3ML) 0.083% IN NEBU
2.5000 mg | INHALATION_SOLUTION | RESPIRATORY_TRACT | Status: DC | PRN
  Administered 2014-10-05: 2.5 mg via RESPIRATORY_TRACT
  Filled 2014-10-05: qty 3

## 2014-10-05 MED ORDER — DARBEPOETIN ALFA 40 MCG/0.4ML IJ SOSY: 40.0000 ug | PREFILLED_SYRINGE | INTRAMUSCULAR | Status: DC

## 2014-10-05 MED ORDER — IPRATROPIUM BROMIDE 0.02 % IN SOLN: 0.5000 mg | RESPIRATORY_TRACT | Status: DC | PRN

## 2014-10-05 MED ORDER — RESOURCE THICKENUP CLEAR PO POWD
ORAL | Status: DC | PRN
  Filled 2014-10-05: qty 125

## 2014-10-05 NOTE — Progress Notes (Signed)
MEDICATION RELATED CONSULT NOTE - INITIAL   Pharmacy Consult for Aranesp  Indication: Anemia of Chronic Kidney Disease  Allergies  Allergen Reactions  . Azithromycin Diarrhea  . Macrodantin [Nitrofurantoin Macrocrystal]     sob    Patient Measurements: Height: 5' (152.4 cm) Weight: 156 lb 9.6 oz (71.033 kg) IBW/kg (Calculated) : 45.5   Vital Signs: Temp: 97.2 F (36.2 C) (10/03 0436) Temp Source: Oral (10/03 0436) BP: 183/63 mmHg (10/03 0905) Pulse Rate: 95 (10/03 0754) Intake/Output from previous day: 10/02 0701 - 10/03 0700 In: 3 [I.V.:3] Out: 2710 [Urine:2710] Intake/Output from this shift: Total I/O In: 28 [P.O.:25; I.V.:3] Out: 1600 [Urine:1600]  Labs:  Recent Labs  10/04/14 0630 10/04/14 1523 10/05/14 0449  WBC 9.7  --   --   HGB 8.1*  --   --   HCT 26.3*  --   --   PLT 287  --   --   CREATININE 2.91* 2.99* 3.21*   Estimated Creatinine Clearance: 13.7 mL/min (by C-G formula based on Cr of 3.21).   Microbiology: No results found for this or any previous visit (from the past 720 hour(s)).  Medical History: Past Medical History  Diagnosis Date  . Diabetes mellitus without complication (HCC)   . Hypercholesteremia   . MI, acute, non ST segment elevation (HCC)     Medications:  Prescriptions prior to admission  Medication Sig Dispense Refill Last Dose  . atorvastatin (LIPITOR) 20 MG tablet Take 20 mg by mouth daily.   10/03/2014 at Unknown time  . clopidogrel (PLAVIX) 75 MG tablet Take 75 mg by mouth daily.   10/03/2014 at Unknown time  . enalapril (VASOTEC) 20 MG tablet Take 20 mg by mouth daily.   10/03/2014 at Unknown time  . fenofibrate (TRICOR) 145 MG tablet Take 145 mg by mouth daily.   10/03/2014 at Unknown time  . insulin aspart (NOVOLOG) 100 UNIT/ML injection Inject 10 Units into the skin every evening. Only when CBG over 130   Past Month at Unknown time  . insulin glargine (LANTUS) 100 UNIT/ML injection Inject 14 Units into the skin at  bedtime.   10/03/2014 at Unknown time  . insulin lispro protamine-lispro (HUMALOG 75/25 MIX) (75-25) 100 UNIT/ML SUSP injection Inject 20 Units into the skin daily with breakfast.   10/03/2014 at Unknown time  . LORazepam (ATIVAN) 1 MG tablet Take 1 mg by mouth 2 (two) times daily as needed. anxiety   10/03/2014 at Unknown time  . omeprazole (PRILOSEC) 20 MG capsule Take 20 mg by mouth daily.   10/03/2014 at Unknown time  . sulfamethoxazole-trimethoprim (BACTRIM DS,SEPTRA DS) 800-160 MG tablet Take 1 tablet by mouth every Monday, Wednesday, and Friday.   10/02/2014    Assessment: 74 y.o female with Anemia of Chronic Kidney Disease, not on dialysis. Anemia panel 10/05/14 is normal.  No reported bleeding. Patient's weight = 71kg. The recommended dose for a non-ICU patient for anemia of chronic illness or CKD without dialysis is :  patient weighing 60 Kg - 100 Kg = 40 mcg darbepoetin alfa SQ every 7 days.    Goal of Therapy:  Hgb to =>11 gm/dL   Plan:  1. Aranesp 40 mcg SQ every Monday, start today.  2. If sustainable Hgb is >11 gm/dL, discontinue the growth factor order (Aranesp).   3. If Hgb increased < 1 gm/dL after 4 weeks and adequate iron stores, folate and vitamin B12 levels were available, the dose of growth factor may be increased or hematology  consulted for anemia.  4. Monitor CBC at least weekly.     Thank you for allowing pharmacy to be part of this patients care team. Noah Delaine, RPh Clinical Pharmacist Pager: [phone] 10/05/2014,10:50 AM

## 2014-10-05 NOTE — Progress Notes (Signed)
  Echocardiogram 2D Echocardiogram has been performed.  Rachel Curry 10/05/2014, 12:45 PM

## 2014-10-05 NOTE — Progress Notes (Signed)
[facility] PROGRESS NOTE  Ennis Delpozo WUJ:[id number] DOB: 1940/11/26 DOA: 10/04/2014 PCP: Gaspar Garbe, MD  Assessment/Plan:  Principal Problem:   Acute pulmonary edema (HCC): continue IV Lasix. Will stop ACE inhibitor due to renal failure. 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 Wylene Simmer. Renal ultrasound without hydronephrosis. Does show Cortical thinning and increased renal echogenicity, suggesting medical renal disease. Suspect progressive chronic kidney disease. Patient would not be a good candidate for dialysis. Husband agrees. No current indication for same. Stop ACE inhibitor.   Abnormal EKG: Troponins flat. Echocardiogram pending. Will repeat EKG.   Anemia in chronic kidney disease: Anemia panel normal. Will start Aranesp. No reported bleeding.   HLD (hyperlipidemia): On statin.   CAD (coronary artery disease): On Plavix.   HTN (hypertension), uncontrolled: Will start IV metoprolol for now pending modified barium swallow.   Bilateral pleural effusion: Secondary to heart failure   UTI (urinary tract infection): On Rocephin. Cultures pending.   Dysphagia: Speech therapy consulting. Had signs of aspiration with dinner. Currently nothing by mouth except for medications. Modified barium swallow pending.   Code Status:  full Family Communication:  Husband and daughter Disposition Plan:  home  Consultants:    Procedures:     Antibiotics:  Ceftriaxone  HPI/Subjective: Unable. Per husband, seems comfortable.  Objective: Filed Vitals:   10/05/14 0754  BP: 181/99  Pulse: 95  Temp:   Resp: 21    Intake/Output Summary (Last 24 hours) at 10/05/14 0857 Last data filed at 10/05/14 0757  Gross  per 24 hour  Intake      6 ml  Output   2700 ml  Net  -2694 ml   Filed Weights   10/04/14 1025 10/04/14 1809 10/05/14 0436  Weight: 72.938 kg (160 lb 12.8 oz) 72.938 kg (160 lb 12.8 oz) 71.033 kg (156 lb 9.6 oz)    Exam:   General:  Alert. Comfortable. Shakes her head "no" to every question posed  Cardiovascular: Regular rate rhythm without murmurs gallops rubs  Respiratory: Clear to auscultation bilaterally without wheeze rhonchi or rales  Abdomen: Soft nontender nondistended  Ext: 1+ edema left greater than right  Basic Metabolic Panel:  Recent Labs Lab 10/04/14 0630 10/04/14 1523 10/05/14 0449  NA 133* 138 138  K 5.1 4.9 4.9  CL 106 110 108  CO2 18* 18* 20*  GLUCOSE 149* 137* 145*  BUN 32* 32* 33*  CREATININE 2.91* 2.99* 3.21*  CALCIUM 8.3* 8.6* 8.7*   Liver Function Tests: No results for input(s): AST, ALT, ALKPHOS, BILITOT, PROT, ALBUMIN in the last 168 hours. No results for input(s): LIPASE, AMYLASE in the last 168 hours. No results for input(s): AMMONIA in the last 168 hours. CBC:  Recent Labs Lab 10/04/14 0630  WBC 9.7  NEUTROABS 6.9  HGB 8.1*  HCT 26.3*  MCV 101.5*  PLT 287   Cardiac Enzymes:  Recent Labs Lab 10/04/14 0952 10/04/14 1523 10/04/14 2123  TROPONINI 0.06* 0.06* 0.08*   BNP (last 3 results)  Recent Labs  10/04/14 0630  BNP 1501.8*    ProBNP (last 3 results) No results for input(s): PROBNP in the last 8760 hours.  CBG:  Recent Labs Lab 10/04/14 1116 10/04/14 1617 10/04/14 2108 10/05/14 0735  GLUCAP  166* 166* 127* 147*    No results found for this or any previous visit (from the past 240 hour(s)).   Studies: US Renal Port  10/04/2014   CLINICAL DATA:  Acute renal failure.  EXAM: RENAL / URINARY TRACT ULTRASOUND COMPLETE  COMPARISON:  None.  FINDINGS: Right Kidney:  Length: 10.3 cm. Increased echogenicity. Renal cortical thinning. 1.1 cm lower pole right renal calcification.  Left Kidney:  Length: 11.2 cm.  Increased echogenicity. Cortical thinning. No hydronephrosis.  Bladder:  Not visualized, likely decompressed.  Note is made of bilateral pleural effusions. Exam is degraded by patient inability to cooperate and habitus.  IMPRESSION: 1.  No hydronephrosis. 2. Decreased sensitivity and specificity exam due to technique related factors, as described above. 3. Cortical thinning and increased renal echogenicity, suggesting medical renal disease. 4. Right lower pole renal collecting system stone versus vascular calcification. 5. Bilateral pleural effusions.   Electronically Signed   By: Jeronimo Greaves M.D.   On: 10/04/2014 13:15   Dg Chest Port 1 View  10/05/2014   CLINICAL DATA:  Acute cardiac failure  EXAM: PORTABLE CHEST - 1 VIEW  COMPARISON:  10/04/2014  FINDINGS: Cardiac shadow is stable but mildly enlarged. Persistent left-sided pleural effusion is noted. Small right-sided pleural effusion is noted as well. Vascular congestion is again noted and stable. No new focal infiltrate is seen.  IMPRESSION: Stable bilateral effusions left greater than right and stable vascular congestion consistent with congestive failure.   Electronically Signed   By: Alcide Clever M.D.   On: 10/05/2014 07:42   Dg Chest Portable 1 View  10/04/2014   CLINICAL DATA:  Shortness of breath for few days, worsening this evening. History of stroke, diabetes, myocardial infarction.  EXAM: PORTABLE CHEST 1 VIEW  COMPARISON:  None.  FINDINGS: The cardiac silhouette is mildly enlarged. Calcified aortic knob. Pulmonary vascular congestion and mild interstitial prominence. Small RIGHT, moderate LEFT pleural effusion, underlying patchy airspace opacity. No pneumothorax. Soft tissue planes and included osseous structure nonsuspicious.  IMPRESSION: Mild cardiomegaly and pulmonary interstitial edema. Moderate LEFT, small RIGHT pleural effusion underlying probable atelectasis or, confluent edema.   Electronically Signed   By: Awilda Metro M.D.   On:  10/04/2014 06:37    Scheduled Meds: . atorvastatin  20 mg Oral q1800  . cefTRIAXone (ROCEPHIN)  IV  1 g Intravenous Q24H  . clopidogrel  75 mg Oral Daily  . enalapril  20 mg Oral Daily  . enoxaparin (LOVENOX) injection  30 mg Subcutaneous Q24H  . fenofibrate  160 mg Oral Daily  . furosemide  80 mg Intravenous Q12H  . Influenza vac split quadrivalent PF  0.5 mL Intramuscular Tomorrow-1000  . insulin aspart  0-5 Units Subcutaneous QHS  . insulin aspart  0-9 Units Subcutaneous TID WC  . insulin glargine  7 Units Subcutaneous Q2200  . sodium chloride  3 mL Intravenous Q12H   Continuous Infusions:   Time spent: 35 minutes  Hinton Luellen L  [facility] [email or web address], password [facility] 10/05/2014, 8:57 AM  LOS: 1 day

## 2014-10-05 NOTE — Evaluation (Signed)
Clinical/Bedside Swallow Evaluation Patient Details  Name: Rachel Curry MRN: [id number] Date of Birth: [birth year]  Today's Date: 10/05/2014 Time: SLP Start Time (ACUTE ONLY): 0840 SLP Stop Time (ACUTE ONLY): 0850 SLP Time Calculation (min) (ACUTE ONLY): 10 min  Past Medical History:  Past Medical History  Diagnosis Date  . Diabetes mellitus without complication (HCC)   . Hypercholesteremia   . MI, acute, non ST segment elevation [facility])    Past Surgical History: History reviewed. No pertinent past surgical history. HPI:  Pt is a 74 year old female patient with PMH of CAD and and myocardial infarction (heart attack) 27 years ago which family reports caused cardiogenic shock and anoxic brain injury. Other history DM, dyslipidemia, and HTN. Reported treated 3 days prior to admission for acute bronchitis. Husband noted pt was more lethargic and wheezing. Presented to ED on 10/04/14 with sob, admitted with CHF exacerbation and UTI. CXR revealed stable bilateral effusions left greater than right and stable vascular congestion consistent with congestive failure. Per MD note, pt is mostly nonverbal, verbal responses are inappropriate. Pt currently NPO, sips with meds.    Assessment / Plan / Recommendation Clinical Impression  During BSE pt demonstrated overt s/sx of aspiration (intermittment immediate cough) with thin liquids. Pt tolerated ice chips, puree, and solids, with no s/sx of aspiration. SLP provided education to pt and pt's husband. SLP recommends MBS to objectively evaluate swallow function due to declined respiratory status.     Aspiration Risk  Mild    Diet Recommendation NPO        Other  Recommendations Oral Care Recommendations: Oral care QID   Follow Up Recommendations       Frequency and Duration min 2x/week  2 weeks   Pertinent Vitals/Pain NA    SLP Swallow Goals     Swallow Study Prior Functional Status       General Other Pertinent Information: Pt is a  74 year old female patient with PMH of CAD and and myocardial infarction (heart attack) 27 years ago which family reports caused cardiogenic shock and anoxic brain injury. Other history DM, dyslipidemia, and HTN. Reported treated 3 days prior to admission for acute bronchitis. Husband noted pt was more lethargic and wheezing. Presented to ED on 10/04/14 with sob, admitted with CHF exacerbation and UTI. CXR revealed stable bilateral effusions left greater than right and stable vascular congestion consistent with congestive failure. Per MD note, pt is mostly nonverbal, verbal responses are inappropriate. Pt currently NPO, sips with meds.  Type of Study: Bedside swallow evaluation Diet Prior to this Study: NPO Temperature Spikes Noted: No Respiratory Status: Supplemental O2 delivered via (comment) History of Recent Intubation: No Behavior/Cognition: Alert;Cooperative;Pleasant mood;Doesn't follow directions;Requires cueing;Other (Comment) (nonverbal) Oral Cavity - Dentition: Adequate natural dentition/normal for age Self-Feeding Abilities: Needs assist Patient Positioning: Upright in bed Baseline Vocal Quality: Normal Volitional Cough: Congested Volitional Swallow: Unable to elicit    Oral/Motor/Sensory Function Overall Oral Motor/Sensory Function: Appears within functional limits for tasks assessed   Ice Chips Ice chips: Impaired Presentation: Spoon Oral Phase Functional Implications: Oral holding Pharyngeal Phase Impairments: Cough - Immediate   Thin Liquid Thin Liquid: Impaired Presentation: Cup;Straw Pharyngeal  Phase Impairments: Cough - Immediate    Nectar Thick Nectar Thick Liquid: Not tested   Honey Thick Honey Thick Liquid: Not tested   Puree Puree: Within functional limits Presentation: Spoon   Solid   GO    Solid: Within functional limits Presentation: Self Fed      Riccardo Dubin, Student-SLP  Riccardo Dubin  10/05/2014,9:09 AM

## 2014-10-06 ENCOUNTER — Inpatient Hospital Stay (HOSPITAL_COMMUNITY): Payer: Medicare Other

## 2014-10-06 LAB — URINE CULTURE: Culture: >=100000

## 2014-10-06 LAB — RENAL FUNCTION PANEL
ANION GAP: 8 (ref 5–15)
Albumin: 2.6 g/dL — ABNORMAL LOW (ref 3.5–5.0)
BUN: 35 mg/dL — ABNORMAL HIGH (ref 6–20)
CALCIUM: 8.8 mg/dL — AB (ref 8.9–10.3)
CO2: 24 mmol/L (ref 22–32)
CREATININE: 3.34 mg/dL — AB (ref 0.44–1.00)
Chloride: 110 mmol/L (ref 101–111)
GFR, EST AFRICAN AMERICAN: 15 mL/min — AB (ref >60)
GFR, EST NON AFRICAN AMERICAN: 13 mL/min — AB (ref >60)
Glucose, Bld: 87 mg/dL (ref 65–99)
Phosphorus: 5.7 mg/dL — ABNORMAL HIGH (ref 2.5–4.6)
Potassium: 4.5 mmol/L (ref 3.5–5.1)
SODIUM: 142 mmol/L (ref 135–145)

## 2014-10-06 LAB — CBC
HCT: 27.4 % — ABNORMAL LOW (ref 36.0–46.0)
HEMOGLOBIN: 8.9 g/dL — AB (ref 12.0–15.0)
MCH: 32.6 pg (ref 26.0–34.0)
MCHC: 32.5 g/dL (ref 30.0–36.0)
MCV: 100.4 fL — ABNORMAL HIGH (ref 78.0–100.0)
PLATELETS: 294 10*3/uL (ref 150–400)
RBC: 2.73 MIL/uL — AB (ref 3.87–5.11)
RDW: 14.2 % (ref 11.5–15.5)
WBC: 8.9 10*3/uL (ref 4.0–10.5)

## 2014-10-06 LAB — GLUCOSE, CAPILLARY
GLUCOSE-CAPILLARY: 108 mg/dL — AB (ref 65–99)
GLUCOSE-CAPILLARY: 189 mg/dL — AB (ref 65–99)
GLUCOSE-CAPILLARY: 133 mg/dL — AB (ref 65–99)
Glucose-Capillary: 212 mg/dL — ABNORMAL HIGH (ref 65–99)
Glucose-Capillary: 222 mg/dL — ABNORMAL HIGH (ref 65–99)

## 2014-10-06 MED ORDER — CARVEDILOL 12.5 MG PO TABS
12.5000 mg | ORAL_TABLET | Freq: Two times a day (BID) | ORAL | Status: DC
  Administered 2014-10-06 – 2014-10-08 (×4): 12.5 mg via ORAL
  Filled 2014-10-06 (×4): qty 1

## 2014-10-06 MED ORDER — FUROSEMIDE 40 MG PO TABS
40.0000 mg | ORAL_TABLET | Freq: Every day | ORAL | Status: DC
  Administered 2014-10-07 – 2014-10-08 (×2): 40 mg via ORAL
  Filled 2014-10-06 (×2): qty 1

## 2014-10-06 MED ORDER — CEPHALEXIN 500 MG PO CAPS
500.0000 mg | ORAL_CAPSULE | Freq: Two times a day (BID) | ORAL | Status: DC
  Administered 2014-10-06 – 2014-10-08 (×4): 500 mg via ORAL
  Filled 2014-10-06 (×4): qty 1

## 2014-10-06 NOTE — Progress Notes (Signed)
Speech Language Pathology Treatment: Dysphagia  Patient Details Name: Rachel Curry MRN: [id number] DOB: January 21, 1940 Today's Date: 10/06/2014 Time: 1100-1115 SLP Time Calculation (min) (ACUTE ONLY): 15 min  Assessment / Plan / Recommendation Clinical Impression  During dysphagia treatment, SLP educated pt's son on aspiration precautions, clinical rationale, how to thicken liquids and feed pt using strategies (controlled small sips/bites), and additional options for pt (HH SLP and using adult sippy cup). SLP provided total assist to pt when feeding, including controlled small straw sips. Pt had one incidence of suspected aspiration after a large straw sip as evidenced by immediate cough. SLP recommends continuing current diet, dysphagia 3 (mech soft) and nectar thick liquids with strict use of precautions to reduce risk of aspiration. Will f/u to check for diet tolerance, use of strategies, and to educate pt's husband.    HPI Other Pertinent Information: Pt is a 74 year old female patient with PMH of CAD and and myocardial infarction (heart attack) 27 years ago which family reports caused cardiogenic shock and anoxic brain injury. Other history DM, dyslipidemia, and HTN. Reported treated 3 days prior to admission for acute bronchitis. Husband noted pt was more lethargic and wheezing. Presented to ED on 10/04/14 with sob, admitted with CHF exacerbation and UTI. CXR revealed stable bilateral effusions left greater than right and stable vascular congestion consistent with congestive failure. Per MD note, pt is mostly nonverbal, verbal responses are inappropriate. Pt currently NPO, sips with meds.    Pertinent Vitals Pain Assessment: Faces Faces Pain Scale: No hurt  SLP Plan  Continue with current plan of care    Recommendations Diet recommendations: Dysphagia 3 (mechanical soft);Nectar-thick liquid Liquids provided via: Straw;Cup Medication Administration: Whole meds with puree Supervision: Trained  caregiver to feed patient;Full supervision/cueing for compensatory strategies Compensations: Small sips/bites;Slow rate Postural Changes and/or Swallow Maneuvers: Seated upright 90 degrees              Oral Care Recommendations: Oral care BID Follow up Recommendations: Home health SLP Plan: Continue with current plan of care    GO    Riccardo Dubin, Student-SLP  Riccardo Dubin 10/06/2014, 11:54 AM

## 2014-10-06 NOTE — Progress Notes (Signed)
Called to pts room by family members with report of period of rapid breathing follow by a 2-3 minute period of unresponsiveness, denies loosing consciousness, new right sided facial droop noted, MD notified via text message, rapid response notified, code stroke called.  Raymon Mutton RN

## 2014-10-06 NOTE — Code Documentation (Addendum)
Responded to a code stroke on the pt after she was found to have a period of unresponsiveness and irregular breathing.  The primary RN described a Rt facial droop as well immediately after this episode.  On assessment the pt is alert and at her baseline of confusion and unable to tell me what her name is or how old she is, though she did ask how old I am.  Her family states this is her baseline. Her speech is clear though limited. She is equally weak in all extremities and I could not appreciate a facial droop. She will track me in the room.  Her exam is nonfocal. I spoke with Dr. Roseanne Reno and the pt will be sent to CT scan and the Code Stroke will be cancelled.

## 2014-10-06 NOTE — Progress Notes (Signed)
[facility] PROGRESS NOTE  Rachel Curry ZOX:[id number] DOB: 11-02-1940 DOA: 10/04/2014 PCP: Gaspar Garbe, MD  Summary 74 year old female patient with history of CAD and MI 27 years ago that apparently resulted in cardiogenic shock and anoxic brain injury, diabetes on insulin, dyslipidemia, hypertension who presents to the ER with shortness of breath. Patient treated recently for acute bronchitis with 3 days of azithromycin 500 mg. Patient was noted to be more lethargic, husband who is the primary caregiver also noticed wheezing. Pulmonary vascular congestion on chest x-ray. Patient admitted for CHF exacerbation  Assessment/Plan:  Principal Problem:   Acute pulmonary edema (HCC) secondary to acute CHF, with mild systolic dysfunction.  Change to po lasix. Add coreg.  stop ACE inhibitor due to renal failure. Echo with EF 45-50% Active Problems:    Acute heart failure (HCC)   Acute respiratory failure with hypoxia (HCC): Wean oxygen as able   DM (diabetes mellitus), type 2 with renal complications [facility]): Continue current   Anoxic brain damage (HCC), chronic   Acute renal failure (HCC): Will get records from Dr. Wylene Simmer. Renal ultrasound without hydronephrosis. Does show Cortical thinning and increased renal echogenicity, suggesting medical renal disease. Suspect progressive chronic kidney disease. Creat above 3 in office. Not a candidate for HD, due to difficulty complying with treatment. Husband agrees   Abnormal EKG: Troponins flat. Echocardiogram pending. Repeat EKG not done. Will reorder   Anemia in chronic kidney disease: Anemia panel normal. Will start Aranesp. No reported bleeding.   HLD (hyperlipidemia): On statin.   CAD (coronary artery disease): On Plavix.   HTN (hypertension), uncontrolled: improved   Bilateral pleural effusion: Secondary to heart failure   UTI (urinary tract infection): cx shows e coli. Change to po abx   Dysphagia: Speech did MBS and rec D3  nectar.   Code Status:  full Family Communication:  son Disposition Plan:  Home with home RN, ST, aide if husband agrees  Consultants:    Procedures:     Antibiotics:  Ceftriaxone  HPI/Subjective: Unable. Per son, seems ok  Objective: Filed Vitals:   10/06/14 0500  BP: 141/60  Pulse:   Temp: 98.1 F (36.7 C)  Resp: 20    Intake/Output Summary (Last 24 hours) at 10/06/14 1231 Last data filed at 10/06/14 0600  Gross per 24 hour  Intake    280 ml  Output   3700 ml  Net  -3420 ml   Filed Weights   10/04/14 1809 10/05/14 0436 10/06/14 0500  Weight: 72.938 kg (160 lb 12.8 oz) 71.033 kg (156 lb 9.6 oz) 66.407 kg (146 lb 6.4 oz)    Exam:   General:  Alert. Comfortable. Shakes her head "no" to most questions  Cardiovascular: Regular rate rhythm without murmurs gallops rubs  Respiratory: Clear to auscultation bilaterally without wheeze rhonchi or rales  Abdomen: Soft nontender nondistended  Ext: 1+ edema left greater than right  Basic Metabolic Panel:  Recent Labs Lab 10/04/14 0630 10/04/14 1523 10/05/14 0449 10/06/14 0520  NA 133* 138 138 142  K 5.1 4.9 4.9 4.5  CL 106 110 108 110  CO2 18* 18* 20* 24  GLUCOSE 149* 137* 145* 87  BUN 32* 32* 33* 35*  CREATININE 2.91* 2.99* 3.21* 3.34*  CALCIUM 8.3* 8.6* 8.7* 8.8*  PHOS  --   --   --  5.7*   Liver Function Tests:  Recent Labs Lab 10/06/14 0520  ALBUMIN 2.6*   No results for input(s): LIPASE, AMYLASE in the last 168 hours. No results  for input(s): AMMONIA in the last 168 hours. CBC:  Recent Labs Lab 10/04/14 0630 10/06/14 0520  WBC 9.7 8.9  NEUTROABS 6.9  --   HGB 8.1* 8.9*  HCT 26.3* 27.4*  MCV 101.5* 100.4*  PLT 287 294   Cardiac Enzymes:  Recent Labs Lab 10/04/14 0952 10/04/14 1523 10/04/14 2123  TROPONINI 0.06* 0.06* 0.08*   BNP (last 3 results)  Recent Labs  10/04/14 0630  BNP 1501.8*    ProBNP (last 3 results) No results for input(s): PROBNP in the last 8760  hours.  CBG:  Recent Labs Lab 10/05/14 1128 10/05/14 1647 10/05/14 2120 10/06/14 0725 10/06/14 1111  GLUCAP 202* 142* 162* 108* 189*    Recent Results (from the past 240 hour(s))  Urine culture     Status: None   Collection Time: 10/04/14  9:40 AM  Result Value Ref Range Status   Specimen Description URINE, CATHETERIZED  Final   Special Requests NONE  Final   Culture >=100,000 COLONIES/mL ESCHERICHIA COLI  Final   Report Status 10/06/2014 FINAL  Final   Organism ID, Bacteria ESCHERICHIA COLI  Final      Susceptibility   Escherichia coli - MIC*    AMPICILLIN >=32 RESISTANT Resistant     CEFAZOLIN <=4 SENSITIVE Sensitive     CEFTRIAXONE <=1 SENSITIVE Sensitive     CIPROFLOXACIN <=0.25 SENSITIVE Sensitive     GENTAMICIN >=16 RESISTANT Resistant     IMIPENEM <=0.25 SENSITIVE Sensitive     NITROFURANTOIN <=16 SENSITIVE Sensitive     TRIMETH/SULFA >=320 RESISTANT Resistant     AMPICILLIN/SULBACTAM 16 INTERMEDIATE Intermediate     PIP/TAZO <=4 SENSITIVE Sensitive     * >=100,000 COLONIES/mL ESCHERICHIA COLI     Studies: US Renal Port  10/04/2014   CLINICAL DATA:  Acute renal failure.  EXAM: RENAL / URINARY TRACT ULTRASOUND COMPLETE  COMPARISON:  None.  FINDINGS: Right Kidney:  Length: 10.3 cm. Increased echogenicity. Renal cortical thinning. 1.1 cm lower pole right renal calcification.  Left Kidney:  Length: 11.2 cm. Increased echogenicity. Cortical thinning. No hydronephrosis.  Bladder:  Not visualized, likely decompressed.  Note is made of bilateral pleural effusions. Exam is degraded by patient inability to cooperate and habitus.  IMPRESSION: 1.  No hydronephrosis. 2. Decreased sensitivity and specificity exam due to technique related factors, as described above. 3. Cortical thinning and increased renal echogenicity, suggesting medical renal disease. 4. Right lower pole renal collecting system stone versus vascular calcification. 5. Bilateral pleural effusions.   Electronically  Signed   By: Jeronimo Greaves M.D.   On: 10/04/2014 13:15   Dg Chest Port 1 View  10/05/2014   CLINICAL DATA:  Acute cardiac failure  EXAM: PORTABLE CHEST - 1 VIEW  COMPARISON:  10/04/2014  FINDINGS: Cardiac shadow is stable but mildly enlarged. Persistent left-sided pleural effusion is noted. Small right-sided pleural effusion is noted as well. Vascular congestion is again noted and stable. No new focal infiltrate is seen.  IMPRESSION: Stable bilateral effusions left greater than right and stable vascular congestion consistent with congestive failure.   Electronically Signed   By: Alcide Clever M.D.   On: 10/05/2014 07:42   Dg Swallowing Func-speech Pathology  10/05/2014    Objective Swallowing Evaluation:    Patient Details  Name: Rachel Curry MRN: [id number] Date of Birth: [birth year]  Today's Date: 10/05/2014 Time: SLP Start Time (ACUTE ONLY): 1005-SLP Stop Time (ACUTE ONLY): 1030 SLP Time Calculation (min) (ACUTE ONLY): 25 min  Past Medical History:  Past Medical  History  Diagnosis Date  . Diabetes mellitus without complication (HCC)   . Hypercholesteremia   . MI, acute, non ST segment elevation [facility])    Past Surgical History: No past surgical history on file. HPI:  Other Pertinent Information: Pt is a 74 year old female patient with PMH  of CAD and and myocardial infarction (heart attack) 27 years ago which  family reports caused cardiogenic shock and anoxic brain injury. Other  history DM, dyslipidemia, and HTN. Reported treated 3 days prior to  admission for acute bronchitis. Husband noted pt was more lethargic and  wheezing. Presented to ED on 10/04/14 with sob, admitted with CHF  exacerbation and UTI. CXR revealed stable bilateral effusions left greater  than right and stable vascular congestion consistent with congestive  failure. Per MD note, pt is mostly nonverbal, verbal responses are  inappropriate. Pt currently NPO, sips with meds.   No Data Recorded  Assessment / Plan / Recommendation CHL IP CLINICAL  IMPRESSIONS 10/05/2014  Therapy Diagnosis Mild pharyngeal phase dysphagia  Clinical Impression Pt demonstrated moderate pharyngeal dysphagia  characterized by delayed swallow initiation due to reduced sensation and  impaired timing with intermittently sensed aspiration of thin liquids. Pt  had delayed swallow initiation of thin and nectar liquids, and delayed  epiglottic closure and airway protection with thin liquids. SLP provided  verbal and tactile cues for small sips, but pt was unable to follow  directions. Pt penetrated nectar when swallowing pill when coordinating  multiple consecutive sips with pill. Pt has a moderate aspiration risk due  to reduced respiratory status and decreased airway protection with thin  liquids. SLP recommends nectar liquids, dysphagia 3 (mech soft) solids,  and pills with puree. SLP will f/u with diet tolerance and liquid  upgrades.       CHL IP TREATMENT RECOMMENDATION 10/05/2014  Treatment Recommendations Therapy as outlined in treatment plan below     CHL IP DIET RECOMMENDATION 10/05/2014  SLP Diet Recommendations Dysphagia 3 (Mech soft);Nectar  Liquid Administration via (None)  Medication Administration Whole meds with puree  Compensations Small sips/bites;Slow rate  Postural Changes and/or Swallow Maneuvers (None)     CHL IP OTHER RECOMMENDATIONS 10/05/2014  Recommended Consults (None)  Oral Care Recommendations Oral care BID  Other Recommendations Order thickener from pharmacy     No flowsheet data found.   CHL IP FREQUENCY AND DURATION 10/05/2014  Speech Therapy Frequency (ACUTE ONLY) min 2x/week  Treatment Duration 2 weeks     Pertinent Vitals/Pain NA    SLP Swallow Goals No flowsheet data found.  No flowsheet data found.    CHL IP REASON FOR REFERRAL 10/05/2014  Reason for Referral Objectively evaluate swallowing function     CHL IP ORAL PHASE 10/05/2014  Lips (None)  Tongue (None)  Mucous membranes (None)  Nutritional status (None)  Other (None)  Oxygen therapy (None)  Oral Phase  WFL  Oral - Pudding Teaspoon (None)  Oral - Pudding Cup (None)  Oral - Honey Teaspoon (None)  Oral - Honey Cup (None)  Oral - Honey Syringe (None)  Oral - Nectar Teaspoon (None)  Oral - Nectar Cup (None)  Oral - Nectar Straw (None)  Oral - Nectar Syringe (None)  Oral - Ice Chips (None)  Oral - Thin Teaspoon (None)  Oral - Thin Cup (None)  Oral - Thin Straw (None)  Oral - Thin Syringe (None)  Oral - Puree (None)  Oral - Mechanical Soft (None)  Oral - Regular (None)  Oral - Multi-consistency (None)  Oral - Pill (None)  Oral Phase - Comment (None)      CHL IP PHARYNGEAL PHASE 10/05/2014  Pharyngeal Phase Impaired  Pharyngeal - Pudding Teaspoon (None)  Penetration/Aspiration details (pudding teaspoon) (None)  Pharyngeal - Pudding Cup (None)  Penetration/Aspiration details (pudding cup) (None)  Pharyngeal - Honey Teaspoon (None)  Penetration/Aspiration details (honey teaspoon) (None)  Pharyngeal - Honey Cup (None)  Penetration/Aspiration details (honey cup) (None)  Pharyngeal - Honey Syringe (None)  Penetration/Aspiration details (honey syringe) (None)  Pharyngeal - Nectar Teaspoon (None)  Penetration/Aspiration details (nectar teaspoon) (None)  Pharyngeal - Nectar Cup (None)  Penetration/Aspiration details (nectar cup) (None)  Pharyngeal - Nectar Straw (None)  Penetration/Aspiration details (nectar straw) (None)  Pharyngeal - Nectar Syringe (None)  Penetration/Aspiration details (nectar syringe) (None)  Pharyngeal - Ice Chips (None)  Penetration/Aspiration details (ice chips) (None)  Pharyngeal - Thin Teaspoon (None)  Penetration/Aspiration details (thin teaspoon) (None)  Pharyngeal - Thin Cup (None)  Penetration/Aspiration details (thin cup) (None)  Pharyngeal - Thin Straw (None)  Penetration/Aspiration details (thin straw) (None)  Pharyngeal - Thin Syringe (None)  Penetration/Aspiration details (thin syringe') (None)  Pharyngeal - Puree (None)  Penetration/Aspiration details (puree) (None)  Pharyngeal - Mechanical Soft  (None)  Penetration/Aspiration details (mechanical soft) (None)  Pharyngeal - Regular (None)  Penetration/Aspiration details (regular) (None)  Pharyngeal - Multi-consistency (None)  Penetration/Aspiration details (multi-consistency) (None)  Pharyngeal - Pill (None)  Penetration/Aspiration details (pill) (None)  Pharyngeal Comment (None)      CHL IP CERVICAL ESOPHAGEAL PHASE 10/05/2014  Cervical Esophageal Phase WFL  Pudding Teaspoon (None)  Pudding Cup (None)  Honey Teaspoon (None)  Honey Cup (None)  Honey Straw (None)  Nectar Teaspoon (None)  Nectar Cup (None)  Nectar Straw (None)  Nectar Sippy Cup (None)  Thin Teaspoon (None)  Thin Cup (None)  Thin Straw (None)  Thin Sippy Cup (None)  Cervical Esophageal Comment (None)    No flowsheet data found.  Completed by Debroah Loop, SLP Student Supervised and reviewed by Harlon Ditty MA CCC-SLP        DeBlois, Riley Nearing 10/05/2014, 12:12 PM     Scheduled Meds: . atorvastatin  20 mg Oral q1800  . cefTRIAXone (ROCEPHIN)  IV  1 g Intravenous Q24H  . clopidogrel  75 mg Oral Daily  . [START ON 10/12/2014] darbepoetin (ARANESP) injection - NON-DIALYSIS  40 mcg Subcutaneous Q Mon-1800  . enoxaparin (LOVENOX) injection  30 mg Subcutaneous Q24H  . furosemide  80 mg Intravenous Q12H  . insulin aspart  0-5 Units Subcutaneous QHS  . insulin aspart  0-9 Units Subcutaneous TID WC  . insulin glargine  7 Units Subcutaneous Q2200  . metoprolol  5 mg Intravenous 4 times per day  . sodium chloride  3 mL Intravenous Q12H   Continuous Infusions:   Time spent: 25 minutes  Bintou Lafata L  [facility] [email or web address], password [facility] 10/06/2014, 12:31 PM  LOS: 2 days

## 2014-10-07 DIAGNOSIS — R1310 Dysphagia, unspecified: Secondary | ICD-10-CM

## 2014-10-07 DIAGNOSIS — N390 Urinary tract infection, site not specified: Secondary | ICD-10-CM

## 2014-10-07 DIAGNOSIS — J810 Acute pulmonary edema: Secondary | ICD-10-CM

## 2014-10-07 DIAGNOSIS — I5021 Acute systolic (congestive) heart failure: Secondary | ICD-10-CM

## 2014-10-07 DIAGNOSIS — E870 Hyperosmolality and hypernatremia: Secondary | ICD-10-CM

## 2014-10-07 DIAGNOSIS — J9601 Acute respiratory failure with hypoxia: Secondary | ICD-10-CM

## 2014-10-07 DIAGNOSIS — E1122 Type 2 diabetes mellitus with diabetic chronic kidney disease: Secondary | ICD-10-CM

## 2014-10-07 DIAGNOSIS — N189 Chronic kidney disease, unspecified: Secondary | ICD-10-CM

## 2014-10-07 DIAGNOSIS — I10 Essential (primary) hypertension: Secondary | ICD-10-CM

## 2014-10-07 DIAGNOSIS — D631 Anemia in chronic kidney disease: Secondary | ICD-10-CM

## 2014-10-07 DIAGNOSIS — E785 Hyperlipidemia, unspecified: Secondary | ICD-10-CM

## 2014-10-07 LAB — GLUCOSE, CAPILLARY
GLUCOSE-CAPILLARY: 208 mg/dL — AB (ref 65–99)
Glucose-Capillary: 161 mg/dL — ABNORMAL HIGH (ref 65–99)
Glucose-Capillary: 226 mg/dL — ABNORMAL HIGH (ref 65–99)
Glucose-Capillary: 233 mg/dL — ABNORMAL HIGH (ref 65–99)

## 2014-10-07 LAB — BASIC METABOLIC PANEL
Anion gap: 13 (ref 5–15)
BUN: 36 mg/dL — AB (ref 6–20)
CO2: 24 mmol/L (ref 22–32)
CREATININE: 3.31 mg/dL — AB (ref 0.44–1.00)
Calcium: 8.6 mg/dL — ABNORMAL LOW (ref 8.9–10.3)
Chloride: 102 mmol/L (ref 101–111)
GFR calc Af Amer: 15 mL/min — ABNORMAL LOW (ref >60)
GFR, EST NON AFRICAN AMERICAN: 13 mL/min — AB (ref >60)
GLUCOSE: 154 mg/dL — AB (ref 65–99)
POTASSIUM: 3.6 mmol/L (ref 3.5–5.1)
SODIUM: 139 mmol/L (ref 135–145)

## 2014-10-07 LAB — C DIFFICILE QUICK SCREEN W PCR REFLEX
C DIFFICILE (CDIFF) INTERP: NEGATIVE
C Diff antigen: NEGATIVE
C Diff toxin: NEGATIVE

## 2014-10-07 NOTE — Progress Notes (Signed)
SATURATION QUALIFICATIONS: (This note is used to comply with regulatory documentation for home oxygen)  Patient Saturations on Room Air at Rest = 85% while lying in bed awake  Patient Saturations on Room Air while Ambulating = NA  Patient Saturations on 1L Liters of oxygen while Ambulating = 93%%  Please briefly explain why patient needs home oxygen:

## 2014-10-07 NOTE — Progress Notes (Signed)
PROGRESS NOTE  Rachel Curry ZDG:[id number] DOB: 1940-05-09 DOA: 10/04/2014 PCP: Gaspar Garbe, MD  HPI: 74 year old female patient with history of CAD and MI 27 years ago that apparently resulted in cardiogenic shock and anoxic brain injury, diabetes on insulin, dyslipidemia, hypertension who presents to the ER with shortness of breath. Patient treated recently for acute bronchitis with 3 days of azithromycin 500 mg. Patient was noted to be more lethargic, husband who is the primary caregiver also noticed wheezing. Pulmonary vascular congestion on chest x-ray. Patient admitted for CHF exacerbation  Subjective / 24 H Interval events - patient minimally verbal, close to baseline per husband  Assessment/Plan: Principal Problem:   Acute pulmonary edema (HCC) Active Problems:   Acute heart failure (HCC)   Acute respiratory failure with hypoxia (HCC)   DM (diabetes mellitus), type 2 with renal complications (HCC)   Anoxic brain damage (HCC)   Acute renal failure (HCC)   Abnormal EKG   Anemia in chronic kidney disease   HLD (hyperlipidemia)   CAD (coronary artery disease)   History of MI (myocardial infarction)   HTN (hypertension)   Bilateral pleural effusion   UTI (urinary tract infection)   Dysphagia   Central cord syndrome at C3 level of cervical spinal cord (HCC)   Hypernatremia   Acute pulmonary edema (HCC) secondary to acute CHF, with mild systolic dysfunction - patient diuresed initially with IV lasix, changed to po Lasix, currently on 40 mg daily. Added Coreg to 12.5 BID, her ACEI was stopped due to renal failure. She underwent a 2D echo which showed an EF 45-50%. It appears that she is net negative 9L and lost about 20 lbs. Renal function stable   Unresponsive episode 10/4 evening - last night patient with unresponsive episode around 7 pm, code stroke called given reports of facial droop, CT scan negative. This morning she appears to be close to baseline. Monitor and if no  further events should be ready for d/c in am   Acute respiratory failure with hypoxia (HCC) - Wean oxygen as able  DM (diabetes mellitus), type 2 with renal complications (HCC) - continue Lantus and sliding scale  Anoxic brain damage (HCC), chronic  Acute renal failure (HCC) - Renal ultrasound without hydronephrosis. It does show Cortical thinning and increased renal echogenicity, suggesting medical renal disease. Suspect progressive chronic kidney disease. Creatinine above 3 in office. Not a candidate for HD, due to difficulty complying with treatment. Husband agrees.  - renal function has remained stable with diuresis  Abnormal EKG: Troponins flat, no chest pain  Anemia in chronic kidney disease: Anemia panel normal. Will start Aranesp. No reported bleeding.  HLD (hyperlipidemia): On statin.  CAD (coronary artery disease): On Plavix.  HTN (hypertension), uncontrolled: improved  Bilateral pleural effusion: Secondary to heart failure  UTI (urinary tract infection): cx shows e coli. Change to po abx  Dysphagia: Speech did MBS and rec D3 nectar.   Diet: DIET DYS 3 Room service appropriate?: Yes; Fluid consistency:: Nectar Thick Fluids: none DVT Prophylaxis: Lovenox  Code Status: Full Code Family Communication: d/w husband and daughter bedside  Disposition Plan: home in 1 day  Consultants:  None   Procedures:  None    Antibiotics Ceftriaxone 10/2 >> 10/4 Keflex 10/4 >>   Studies  Ct Head Wo Contrast  10/06/2014   CLINICAL DATA:  Right facial droop.  EXAM: CT HEAD WITHOUT CONTRAST  TECHNIQUE: Contiguous axial images were obtained from the base of the skull through the vertex without intravenous contrast.  COMPARISON:  CT scan of August 25, 2008.  FINDINGS: Bony calvarium appears intact. Mild diffuse cortical atrophy is noted. Moderate chronic ischemic white matter disease is noted. No mass effect or midline shift is noted. Ventricular size is within normal limits.  There is no evidence of mass lesion, hemorrhage or acute infarction.  IMPRESSION: Mild diffuse cortical atrophy. Moderate chronic ischemic white matter disease. No acute intracranial abnormality seen. Critical Value/emergent results were called by telephone at the time of interpretation on 10/06/2014 at 7:32 pm to Dr. Roseanne Reno, who verbally acknowledged these results.   Electronically Signed   By: Lupita Raider, M.D.   On: 10/06/2014 19:33    Objective  Filed Vitals:   10/06/14 2052 10/07/14 0417 10/07/14 0527 10/07/14 0848  BP:  160/68  170/57  Pulse:  71  74  Temp: 98.7 F (37.1 C) 98.5 F (36.9 C)    TempSrc: Oral     Resp:      Height:      Weight:   63.776 kg (140 lb 9.6 oz)   SpO2: 93% 91%      Intake/Output Summary (Last 24 hours) at 10/07/14 1218 Last data filed at 10/07/14 0512  Gross per 24 hour  Intake      0 ml  Output   1775 ml  Net  -1775 ml   Filed Weights   10/05/14 0436 10/06/14 0500 10/07/14 0527  Weight: 71.033 kg (156 lb 9.6 oz) 66.407 kg (146 lb 6.4 oz) 63.776 kg (140 lb 9.6 oz)    Exam:  GENERAL: NAD, minimally verbal  HEENT: head NCAT, no scleral icterus. Pupils round and reactive.   NECK: Supple.   LUNGS: Clear to auscultation. No wheezing or crackles  HEART: Regular rate and rhythm without murmur. 2+ pulses, no JVD, no peripheral edema  ABDOMEN: Soft, nontender, and nondistended. Positive bowel sounds.   EXTREMITIES: Without any cyanosis, clubbing  NEUROLOGIC: moves all 4, does not follow commands  SKIN: No ulceration or induration present.  Data Reviewed: Basic Metabolic Panel:  Recent Labs Lab 10/04/14 0630 10/04/14 1523 10/05/14 0449 10/06/14 0520 10/07/14 0553  NA 133* 138 138 142 139  K 5.1 4.9 4.9 4.5 3.6  CL 106 110 108 110 102  CO2 18* 18* 20* 24 24  GLUCOSE 149* 137* 145* 87 154*  BUN 32* 32* 33* 35* 36*  CREATININE 2.91* 2.99* 3.21* 3.34* 3.31*  CALCIUM 8.3* 8.6* 8.7* 8.8* 8.6*  PHOS  --   --   --  5.7*  --     Liver Function Tests:  Recent Labs Lab 10/06/14 0520  ALBUMIN 2.6*   CBC:  Recent Labs Lab 10/04/14 0630 10/06/14 0520  WBC 9.7 8.9  NEUTROABS 6.9  --   HGB 8.1* 8.9*  HCT 26.3* 27.4*  MCV 101.5* 100.4*  PLT 287 294   Cardiac Enzymes:  Recent Labs Lab 10/04/14 0952 10/04/14 1523 10/04/14 2123  TROPONINI 0.06* 0.06* 0.08*   BNP (last 3 results)  Recent Labs  10/04/14 0630  BNP 1501.8*    CBG:  Recent Labs Lab 10/06/14 1648 10/06/14 1850 10/06/14 2046 10/07/14 0759 10/07/14 1135  GLUCAP 133* 212* 222* 161* 226*    Recent Results (from the past 240 hour(s))  Urine culture     Status: None   Collection Time: 10/04/14  9:40 AM  Result Value Ref Range Status   Specimen Description URINE, CATHETERIZED  Final   Special Requests NONE  Final   Culture >=100,000 COLONIES/mL ESCHERICHIA  COLI  Final   Report Status 10/06/2014 FINAL  Final   Organism ID, Bacteria ESCHERICHIA COLI  Final      Susceptibility   Escherichia coli - MIC*    AMPICILLIN >=32 RESISTANT Resistant     CEFAZOLIN <=4 SENSITIVE Sensitive     CEFTRIAXONE <=1 SENSITIVE Sensitive     CIPROFLOXACIN <=0.25 SENSITIVE Sensitive     GENTAMICIN >=16 RESISTANT Resistant     IMIPENEM <=0.25 SENSITIVE Sensitive     NITROFURANTOIN <=16 SENSITIVE Sensitive     TRIMETH/SULFA >=320 RESISTANT Resistant     AMPICILLIN/SULBACTAM 16 INTERMEDIATE Intermediate     PIP/TAZO <=4 SENSITIVE Sensitive     * >=100,000 COLONIES/mL ESCHERICHIA COLI     Scheduled Meds: . atorvastatin  20 mg Oral q1800  . carvedilol  12.5 mg Oral BID WC  . cephALEXin  500 mg Oral Q12H  . clopidogrel  75 mg Oral Daily  . [START ON 10/12/2014] darbepoetin (ARANESP) injection - NON-DIALYSIS  40 mcg Subcutaneous Q Mon-1800  . enoxaparin (LOVENOX) injection  30 mg Subcutaneous Q24H  . furosemide  40 mg Oral Daily  . insulin aspart  0-5 Units Subcutaneous QHS  . insulin aspart  0-9 Units Subcutaneous TID WC  . insulin glargine   7 Units Subcutaneous Q2200  . sodium chloride  3 mL Intravenous Q12H   Continuous Infusions:   Time spent coordinating care: 25 minutes, greater than 50% of this time was spent in counseling, explanation of diagnosis and answering questions with family bedside.   Pamella Pert, MD [facility] Pager [phone]. If 7 PM - 7 AM, please contact night-coverage at [email or web address], password [facility] 10/07/2014, 12:18 PM  LOS: 3 days

## 2014-10-07 NOTE — Progress Notes (Addendum)
Alert received that patients o2 sat was low.  Came to assess patient O2 sat=77% on RA while asleep. I woke patient up, O2 sat came up to 93-94% I reapplied O2 at 1 L Dr. Elvera Lennox notified.  He stated to do over night pulse oximetry to qualify patient for home o2 at night.

## 2014-10-07 NOTE — Progress Notes (Signed)
Nutrition Brief Note  Patient identified on the Low [place] Report. Son reports no recent weight loss.  Body mass index is 27.46 kg/(m^2). Patient meets criteria for overweight based on current BMI.   Current diet order is dysphagia 3 with nectar thick liquids, patient is consuming approximately 50-100% of meals at this time. Labs and medications reviewed.   No nutrition interventions warranted at this time. If nutrition issues arise, please consult RD.   Joaquin Courts, RD, LDN, CNSC Pager [phone] After Hours Pager [phone]

## 2014-10-08 DIAGNOSIS — N184 Chronic kidney disease, stage 4 (severe): Secondary | ICD-10-CM

## 2014-10-08 DIAGNOSIS — G931 Anoxic brain damage, not elsewhere classified: Secondary | ICD-10-CM

## 2014-10-08 DIAGNOSIS — E871 Hypo-osmolality and hyponatremia: Secondary | ICD-10-CM

## 2014-10-08 LAB — GLUCOSE, CAPILLARY
GLUCOSE-CAPILLARY: 173 mg/dL — AB (ref 65–99)
GLUCOSE-CAPILLARY: 267 mg/dL — AB (ref 65–99)

## 2014-10-08 MED ORDER — CARVEDILOL 12.5 MG PO TABS: 12.5000 mg | ORAL_TABLET | Freq: Two times a day (BID) | ORAL | Status: DC

## 2014-10-08 MED ORDER — CEPHALEXIN 500 MG PO CAPS: 500.0000 mg | ORAL_CAPSULE | Freq: Two times a day (BID) | ORAL | Status: DC

## 2014-10-08 MED ORDER — FUROSEMIDE 40 MG PO TABS: 40.0000 mg | ORAL_TABLET | Freq: Every day | ORAL | Status: DC

## 2014-10-08 NOTE — Discharge Instructions (Signed)
Heart Failure Heart failure means your heart has trouble pumping blood. This makes it hard for your body to work well. Heart failure is usually a long-term (chronic) condition. You must take good care of yourself and follow your doctor's treatment plan. HOME CARE  Take your heart medicine as told by your doctor.  Do not stop taking medicine unless your doctor tells you to.  Do not skip any dose of medicine.  Refill your medicines before they run out.  Take other medicines only as told by your doctor or pharmacist.  Stay active if told by your doctor. The elderly and people with severe heart failure should talk with a doctor about physical activity.  Eat heart-healthy foods. Choose foods that are without trans fat and are low in saturated fat, cholesterol, and salt (sodium). This includes fresh or frozen fruits and vegetables, fish, lean meats, fat-free or low-fat dairy foods, whole grains, and high-fiber foods. Lentils and dried peas and beans (legumes) are also good choices.  Limit salt if told by your doctor.  Cook in a healthy way. Roast, grill, broil, bake, poach, steam, or stir-fry foods.  Limit fluids as told by your doctor.  Weigh yourself every morning. Do this after you pee (urinate) and before you eat breakfast. Write down your weight to give to your doctor.  Take your blood pressure and write it down if your doctor tells you to.  Ask your doctor how to check your pulse. Check your pulse as told.  Lose weight if told by your doctor.  Stop smoking or chewing tobacco. Do not use gum or patches that help you quit without your doctor's approval.  Schedule and go to doctor visits as told.  Nonpregnant women should have no more than 1 drink a day. Men should have no more than 2 drinks a day. Talk to your doctor about drinking alcohol.  Stop illegal drug use.  Stay current with shots (immunizations).  Manage your health conditions as told by your doctor.  Learn to  manage your stress.  Rest when you are tired.  If it is really hot outside:  Avoid intense activities.  Use air conditioning or fans, or get in a cooler place.  Avoid caffeine and alcohol.  Wear loose-fitting, lightweight, and light-colored clothing.  If it is really cold outside:  Avoid intense activities.  Layer your clothing.  Wear mittens or gloves, a hat, and a scarf when going outside.  Avoid alcohol.  Learn about heart failure and get support as needed.  Get help to maintain or improve your quality of life and your ability to care for yourself as needed. GET HELP IF:   You gain weight quickly.  You are more short of breath than usual.  You cannot do your normal activities.  You tire easily.  You cough more than normal, especially with activity.  You have any or more puffiness (swelling) in areas such as your hands, feet, ankles, or belly (abdomen).  You cannot sleep because it is hard to breathe.  You feel like your heart is beating fast (palpitations).  You get dizzy or light-headed when you stand up. GET HELP RIGHT AWAY IF:   You have trouble breathing.  There is a change in mental status, such as becoming less alert or not being able to focus.  You have chest pain or discomfort.  You faint. MAKE SURE YOU:   Understand these instructions.  Will watch your condition.  Will get help right away if you  are not doing well or get worse.   This information is not intended to replace advice given to you by your health care provider. Make sure you discuss any questions you have with your health care provider.   Document Released: 09/28/2007 Document Revised: 01/09/2014 Document Reviewed: 02/05/2012 Elsevier Interactive Patient Education 2016 [employer]   Low-Sodium Eating Plan Sodium raises blood pressure and causes water to be held in the body. Getting less sodium from food will help lower your blood pressure, reduce any swelling, and protect  your heart, liver, and kidneys. We get sodium by adding salt (sodium chloride) to food. Most of our sodium comes from canned, boxed, and frozen foods. Restaurant foods, fast foods, and pizza are also very high in sodium. Even if you take medicine to lower your blood pressure or to reduce fluid in your body, getting less sodium from your food is important. WHAT IS MY PLAN? Most people should limit their sodium intake to 2,300 mg a day. Your health care provider recommends that you limit your sodium intake to __________ a day.  WHAT DO I NEED TO KNOW ABOUT THIS EATING PLAN? For the low-sodium eating plan, you will follow these general guidelines:  Choose foods with a % Daily Value for sodium of less than 5% (as listed on the food label).   Use salt-free seasonings or herbs instead of table salt or sea salt.   Check with your health care provider or pharmacist before using salt substitutes.   Eat fresh foods.  Eat more vegetables and fruits.  Limit canned vegetables. If you do use them, rinse them well to decrease the sodium.   Limit cheese to 1 oz (28 g) per day.   Eat lower-sodium products, often labeled as "lower sodium" or "no salt added."  Avoid foods that contain monosodium glutamate (MSG). MSG is sometimes added to [place] food and some canned foods.  Check food labels (Nutrition Facts labels) on foods to learn how much sodium is in one serving.  Eat more home-cooked food and less restaurant, buffet, and fast food.  When eating at a restaurant, ask that your food be prepared with less salt, or no salt if possible.  HOW DO I READ FOOD LABELS FOR SODIUM INFORMATION? The Nutrition Facts label lists the amount of sodium in one serving of the food. If you eat more than one serving, you must multiply the listed amount of sodium by the number of servings. Food labels may also identify foods as:  Sodium free--Less than 5 mg in a serving.  Very low sodium--35 mg or less in a  serving.  Low sodium--140 mg or less in a serving.  Light in sodium--50% less sodium in a serving. For example, if a food that usually has 300 mg of sodium is changed to become light in sodium, it will have 150 mg of sodium.  Reduced sodium--25% less sodium in a serving. For example, if a food that usually has 400 mg of sodium is changed to reduced sodium, it will have 300 mg of sodium. WHAT FOODS CAN I EAT? Grains Low-sodium cereals, including oats, puffed wheat and rice, and shredded wheat cereals. Low-sodium crackers. Unsalted rice and pasta. Lower-sodium bread.  Vegetables Frozen or fresh vegetables. Low-sodium or reduced-sodium canned vegetables. Low-sodium or reduced-sodium tomato sauce and paste. Low-sodium or reduced-sodium tomato and vegetable juices.  Fruits Fresh, frozen, and canned fruit. Fruit juice.  Meat and Other Protein Products Low-sodium canned tuna and salmon. Fresh or frozen meat, poultry,  seafood, and fish. Lamb. Unsalted nuts. Dried beans, peas, and lentils without added salt. Unsalted canned beans. Homemade soups without salt. Eggs.  Dairy Milk. Soy milk. Ricotta cheese. Low-sodium or reduced-sodium cheeses. Yogurt.  Condiments Fresh and dried herbs and spices. Salt-free seasonings. Onion and garlic powders. Low-sodium varieties of mustard and ketchup. Fresh or refrigerated horseradish. Lemon juice.  Fats and Oils Reduced-sodium salad dressings. Unsalted butter.  Other Unsalted popcorn and pretzels.  The items listed above may not be a complete list of recommended foods or beverages. Contact your dietitian for more options. WHAT FOODS ARE NOT RECOMMENDED? Grains Instant hot cereals. Bread stuffing, pancake, and biscuit mixes. Croutons. Seasoned rice or pasta mixes. Noodle soup cups. Boxed or frozen macaroni and cheese. Self-rising flour. Regular salted crackers. Vegetables Regular canned vegetables. Regular canned tomato sauce and paste. Regular tomato  and vegetable juices. Frozen vegetables in sauces. Salted [place] fries. Olives. Rosita Fire. Relishes. Sauerkraut. Salsa. Meat and Other Protein Products Salted, canned, smoked, spiced, or pickled meats, seafood, or fish. Bacon, ham, sausage, hot dogs, corned beef, chipped beef, and packaged luncheon meats. Salt pork. Jerky. Pickled herring. Anchovies, regular canned tuna, and sardines. Salted nuts. Dairy Processed cheese and cheese spreads. Cheese curds. Blue cheese and cottage cheese. Buttermilk.  Condiments Onion and garlic salt, seasoned salt, table salt, and sea salt. Canned and packaged gravies. Worcestershire sauce. Tartar sauce. Barbecue sauce. Teriyaki sauce. Soy sauce, including reduced sodium. Steak sauce. Fish sauce. Oyster sauce. Cocktail sauce. Horseradish that you find on the shelf. Regular ketchup and mustard. Meat flavorings and tenderizers. Bouillon cubes. Hot sauce. Tabasco sauce. Marinades. Taco seasonings. Relishes. Fats and Oils Regular salad dressings. Salted butter. Margarine. Ghee. Bacon fat.  Other Potato and tortilla chips. Corn chips and puffs. Salted popcorn and pretzels. Canned or dried soups. Pizza. Frozen entrees and pot pies.  The items listed above may not be a complete list of foods and beverages to avoid. Contact your dietitian for more information.   This information is not intended to replace advice given to you by your health care provider. Make sure you discuss any questions you have with your health care provider.   Document Released: 06/10/2001 Document Revised: 01/09/2014 Document Reviewed: 10/23/2012 Elsevier Interactive Patient Education 2016 [employer] Inc[employer]  Oxygen Use at Home Oxygen can be prescribed for home use. The prescription will show the flow rate. This is how much oxygen is to be used per minute. This will be listed in liters per minute (LPM or L/M). A liter is a metric measurement of volume. You will use oxygen therapy as directed. It can  be used while exercising, sleeping, or at rest. You may need oxygen continuously. Your health care provider may order a blood oxygen test (arterial blood gas or pulse oximetry test) that will show what your oxygen level is. Your health care provider will use these measurements to learn about your needs and follow your progress. Home oxygen therapy is commonly used on patients with various lung (pulmonary) related conditions. Some of these conditions include:  Asthma.  Lung cancer.  Pneumonia.  Emphysema.  Chronic bronchitis.  Cystic fibrosis.  Other lung diseases.  Pulmonary fibrosis.  Occupational lung disease.  Heart failure.  Chronic obstructive pulmonary disease (COPD). 3 COMMON WAYS OF PROVIDING OXYGEN THERAPY  Gas: The gas form of oxygen is put into variously sized cylinders or tanks. The cylinders or oxygen tanks contain compressed oxygen. The cylinder is equipped with a regulator that controls the flow rate. Because the flow of  oxygen out of the cylinder is constant, an oxygen conserving device may be attached to the system to avoid waste. This device releases the gas only when you inhale and cuts it off when you exhale. Oxygen can be provided in a small cylinder that can be carried with you. Large tanks are heavy and are only for stationary use. After use, empty tanks must be exchanged for full tanks.  Liquid: The liquid form of oxygen is put into a container similar to a thermos. When released, the liquid converts to a gas and you breathe it in just like the compressed gas. This storage method takes up less space than the compressed gas cylinder, and you can transfer the liquid to a small, portable vessel at home. Liquid oxygen is more expensive than the compressed gas, and the vessel vents when not in use. An oxygen conserving device may be built into the vessel to conserve the oxygen. Liquid oxygen is very cold, around 297 below zero.  Oxygen concentrator: This medical  device filters oxygen from room air and gives almost 100% oxygen to the patient. Oxygen concentrators are powered by electricity. Benefits of this system are:  It does not need to be resupplied.  It is not as costly as liquid oxygen.  Extra tubing permits the user to move around easier. There are several types of small, portable oxygen systems available which can help you remain active and mobile. You must have a cylinder of oxygen as a backup in the event of a power failure. Advise your electric power company that you are on oxygen therapy in order to get priority service when there is a power failure. OXYGEN DELIVERY DEVICES There are 3 common ways to deliver oxygen to your body.  Nasal cannula. This is a 2-pronged device inserted in the nostrils that is connected to tubing carrying the oxygen. The tubing can rest on the ears or be attached to the frame of eyeglasses.  Mask. People who need a high flow of oxygen generally use a mask.  Transtracheal catheter. Transtracheal oxygen therapy requires the insertion of a small, flexible tube (catheter) in the windpipe (trachea). This catheter is held in place by a necklace. Since transtracheal oxygen bypasses the mouth, nose, and throat, a humidifier is absolutely required at flow rates of 1 LPM or greater. OXYGEN USE SAFETY TIPS  Never smoke while using oxygen. Oxygen does not burn or explode, but flammable materials will burn faster in the presence of oxygen.  Keep a Government social research officer close by. Let your fire department know that you have oxygen in your home.  Warn visitors not to smoke near you when you are using oxygen. Put up "no smoking" signs in your home where you most often use the oxygen.  When you go to a restaurant with your portable oxygen source, ask to be seated in the nonsmoking section.  Stay at least 5 feet away from gas stoves, candles, lighted fireplaces, or other heat sources.  Do not use materials that burn easily  (flammable) while using your oxygen.  If you use an oxygen cylinder, make sure it is secured to some fixed object or in a stand. If you use liquid oxygen, make sure the vessel is kept upright to keep the oxygen from pouring out. Liquid oxygen is so cold it can hurt your skin.  If you use an oxygen concentrator, call your electric company so you will be given priority service if your power goes out. Avoid using extension cords, if  possible.  Regularly test your smoke detectors at home to make sure they work. If you receive care in your home from a nurse or other health care provider, he or she may also check to make sure your smoke detectors work. GUIDELINES FOR CLEANING YOUR EQUIPMENT  Wash the nasal prongs with a liquid soap. Thoroughly rinse them once or twice a week.  Replace the prongs every 2 to 4 weeks. If you have an infection (cold, pneumonia) change them when you are well.  Your health care provider will give you instructions on how to clean your transtracheal catheter.  The humidifier bottle should be washed with soap and warm water and rinsed thoroughly between each refill. Air-dry the bottle before filling it with sterile or distilled water. The bottle and its top should be disinfected after they are cleaned.  If you use an oxygen concentrator, unplug the unit. Then wipe down the cabinet with a damp cloth and dry it daily. The air filter should be cleaned at least twice a week.  Follow your home medical equipment and service company's directions for cleaning the compressor filter. HOME CARE INSTRUCTIONS   Do not change the flow of oxygen unless directed by your health care provider.  Do not use alcohol or other sedating drugs unless instructed. They slow your breathing rate.  Do not use materials that burn easily (flammable) while using your oxygen.  Always keep a spare tank of oxygen. Plan ahead for holidays when you may not be able to get a prescription filled.  Use  water-based lubricants on your lips or nostrils. Do not use an oil-based product like petroleum jelly.  To prevent your cheeks or the skin behind your ears from becoming irritated, tuck some gauze under the tubing.  If you have persistent redness under your nose, call your health care provider.  When you no longer need oxygen, your doctor will have the oxygen discontinued. Oxygen is not addicting or habit forming.  Use the oxygen as instructed. Too much oxygen can be harmful and too little will not give you the benefit you need.  Shortness of breath is not always from a lack of oxygen. If your oxygen level is not the cause of your shortness of breath, taking oxygen will not help. SEEK MEDICAL CARE IF:   You have frequent headaches.  You have shortness of breath or a lasting cough.  You have anxiety.  You are confused.  You are drowsy or sleepy all the time.  You develop an illness which aggravates your breathing.  You cannot exercise.  You are restless.  You have blue lips or fingernails.  You have difficult or irregular breathing and it is getting worse.  You have a fever.   This information is not intended to replace advice given to you by your health care provider. Make sure you discuss any questions you have with your health care provider.   Document Released: 03/11/2003 Document Revised: 01/09/2014 Document Reviewed: 07/31/2012 Elsevier Interactive Patient Education [employer]

## 2014-10-08 NOTE — Discharge Summary (Addendum)
Physician Discharge Summary  Sundy Houchins ONG:[id number] DOB: 18-Jun-1940 DOA: 10/04/2014  PCP: Gaspar Garbe, MD  Admit date: 10/04/2014 Discharge date: 10/08/2014  Time spent: > 35 minutes  Recommendations for Outpatient Follow-up:  1. Follow up with Dr. 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 According to the family the patient has had a progressive decline of the past 3-4 weeks with weight loss and anorexia and she stopped feeding herself. The past 2-3 days she's had increasing respiratory symptoms primarily with wheezing and what appears to be orthopnea. At baseline she would walk with assistance but has been refusing to do so and when she is up and is unable to walk as far as she had been able to. According to the family during this time. The patient has been evaluated  by primary care physician and it was noted that her renal function had worsened and she had some hyperkalemia. Apparently medications were adjusted and primary care physician spoke with the husband about wishes regarding dialysis and both agree dialysis would be inappropriate for this patient. The cause of her underlying anoxic brain injury the patient is unable to answer questions. She is not had any issues with nausea vomiting diarrhea any apparent abdominal pain any apparent chest pain or headache, no skin changes or decubiti, no change in urination. Upon arrival to the ER she was found to be hypertensive with a blood pressure of 172/77, heart rate 89, O2 saturations were 91-93% on room air and she is subsequently been placed on nasal cannula oxygen, she was not tachypneic, portable chest x-ray revealed mild cardiomegaly and pulmonary interstitial edema with a moderate left pleural effusion and a small right pleural effusion. Laboratory data reveal hyponatremia sodium 133, metabolic acidemia at the CO2 of 18, acute renal failure with a BUN of 32 and creatinine 2.91 with baseline renal function unknown although in 2010 patient's urine was 24 and creatinine was 1.3. BNP elevated at 1502, hemoglobin low at 8.1 with macrocytosis MCV 101.5, baseline hemoglobin unknown. No leukocytosis and platelets are normal, slightly elevated at 149. Urinalysis has been obtained and is abnormal and appears consistent with a UTI with cloudy appearance, few bacteria, moderate leukocytes, protein 100, few squamous epithelials and, in 11-20 WBCs. Urine specific gravity borderline low at 1.007. Fecal occult blood was negative.  Hospital Course:  Acute pulmonary edema (HCC) secondary to acute CHF, with mild systolic dysfunction - patient diuresed initially with IV lasix, changed to po Lasix, currently on 40 mg daily. Added Coreg to 12.5 BID, her ACEI was stopped due to renal failure. She underwent a 2D echo which showed an EF 45-50%.  She is net negative 9L and lost about 20 lbs with diuresis, and appears euvolemic on discharge. Renal function is stable for her.  Acute respiratory failure with hypoxia (HCC) - Wean oxygen, patient doing well while awake however needs 1L NS while sleeping, suspect possible OSA. Discharged home with O2. May need outpatient sleep study. DM (diabetes mellitus), type 2 with renal complications (HCC) - continue home regimen on d/c Anoxic brain damage (HCC), chronic Acute renal failure (HCC) on CKD IV- Renal ultrasound without hydronephrosis. It does show Cortical thinning and increased renal echogenicity, suggesting medical renal disease. Suspect progressive chronic kidney disease. Per Dr. Lendell Caprice, her creatinine above 3 in office as an outpatient. Not a candidate for HD, due to difficulty complying with treatment. Husband agrees. Renal function has remained stable with diuresis Abnormal EKG: Troponins flat, no chest pain Anemia in chronic kidney disease: Anemia panel normal. HLD (hyperlipidemia): On statin. CAD (coronary artery disease): On Plavix. HTN (hypertension), uncontrolled: improved Bilateral pleural effusion: Secondary to heart failure UTI (urinary tract infection): cx shows e coli. She was initially on Ceftriaxone, after sensitivities became available she was change to po abx, she is to continue keflex for 3 additional days. Dysphagia: Speech did MBS and rec D3 nectar.  Procedures:  2D echo  Study Conclusions - Left ventricle: The cavity size was normal. Wall thickness wasincreased in a pattern of mild LVH. Systolic function was mildly reduced. The estimated ejection fraction was in the range of 45%to 50%. - Mitral valve: There was moderate regurgitation. - Left atrium: The atrium was moderately dilated.  Consultations:  None   Discharge Exam: Filed Vitals:   10/07/14 2259 10/08/14 0442 10/08/14 0859 10/08/14 0900  BP:  165/55 179/68 179/68  Pulse: 66 67 76 76  Temp:  98.6  F (37 C)    TempSrc:  Oral    Resp: Height:      Weight:  66.225 kg (146 lb)    SpO2: 95% 97%  93%   General: NAD Cardiovascular: RRR Respiratory: CTA biL  Discharge Instructions    Medication List    STOP taking these medications        enalapril 20 MG tablet  Commonly known as:  VASOTEC     sulfamethoxazole-trimethoprim 800-160 MG tablet  Commonly known as:  BACTRIM DS,SEPTRA DS      TAKE these medications        atorvastatin 20 MG tablet  Commonly known as:  LIPITOR  Take 20 mg by mouth daily.  Notes to Patient:  Take tonight      carvedilol 12.5 MG tablet  Commonly known as:  COREG  Take 1 tablet (12.5 mg total) by mouth 2 (two) times daily with a meal.  Notes to Patient:  Take one dose tonight at dinner     cephALEXin 500 MG capsule  Commonly known as:  KEFLEX  Take 1 capsule (500 mg total) by mouth 2 (two) times daily.  Notes to Patient:  Take one dose tonight     clopidogrel 75 MG tablet  Commonly known as:  PLAVIX  Take 75 mg by mouth daily.     fenofibrate 145  MG tablet  Commonly known as:  TRICOR  Take 145 mg by mouth daily.     furosemide 40 MG tablet  Commonly known as:  LASIX  Take 1 tablet (40 mg total) by mouth daily.     insulin aspart 100 UNIT/ML injection  Commonly known as:  novoLOG  Inject 10 Units into the skin every evening. Only when CBG over 130  Notes to Patient:  Take tonight     insulin glargine 100 UNIT/ML injection  Commonly known as:  LANTUS  Inject 14 Units into the skin at bedtime.  Notes to Patient:  Take tonight     insulin lispro protamine-lispro (75-25) 100 UNIT/ML Susp injection  Commonly known as:  HUMALOG 75/25 MIX  Inject 20 Units into the skin daily with breakfast.     LORazepam 1 MG tablet  Commonly known as:  ATIVAN  Take 1 mg by mouth 2 (two) times daily as needed. anxiety     omeprazole 20 MG capsule  Commonly known as:  PRILOSEC  Take 20 mg by mouth daily.       Follow-up  Information    Follow up with Gaspar Garbe, MD. Go on 10/15/2014.   Specialty:  Internal Medicine   Why:  @3 :15   Contact information:   [address] [place] [address] [phone]       Follow up with [facility].   Why:  Registered Nurse and Speech.    Contact information:   [address] [place] [address] [phone]       The results of significant diagnostics from this hospitalization (including imaging, microbiology, ancillary and laboratory) are listed below for reference.    Significant Diagnostic Studies: Ct Head Wo Contrast  10/06/2014   CLINICAL DATA:  Right facial droop.  EXAM: CT HEAD WITHOUT CONTRAST  TECHNIQUE: Contiguous axial images were obtained from the base of the skull through the vertex without intravenous contrast.  COMPARISON:  CT scan of August 25, 2008.  FINDINGS: Bony calvarium appears intact. Mild diffuse cortical atrophy is noted. Moderate chronic ischemic white matter disease is noted. No mass effect or midline shift is noted. Ventricular size is within normal limits. There is no evidence of mass lesion, hemorrhage or acute infarction.  IMPRESSION: Mild diffuse cortical atrophy. Moderate chronic ischemic white matter disease. No acute intracranial abnormality seen. Critical Value/emergent results were called by telephone at the time of interpretation on 10/06/2014 at 7:32 pm to Dr. Roseanne Reno, who verbally acknowledged these results.   Electronically Signed   By: Lupita Raider, M.D.   On: 10/06/2014 19:33   US Renal Port  10/04/2014   CLINICAL DATA:  Acute renal failure.  EXAM: RENAL / URINARY TRACT ULTRASOUND COMPLETE  COMPARISON:  None.  FINDINGS: Right Kidney:  Length: 10.3 cm. Increased echogenicity. Renal cortical thinning. 1.1 cm lower pole right renal calcification.  Left Kidney:  Length: 11.2 cm. Increased echogenicity. Cortical thinning. No hydronephrosis.  Bladder:  Not visualized, likely decompressed.  Note is  made of bilateral pleural effusions. Exam is degraded by patient inability to cooperate and habitus.  IMPRESSION: 1.  No hydronephrosis. 2. Decreased sensitivity and specificity exam due to technique related factors, as described above. 3. Cortical thinning and increased renal echogenicity, suggesting medical renal disease. 4. Right lower pole renal collecting system stone versus vascular calcification. 5. Bilateral pleural effusions.   Electronically Signed   By: Jeronimo Greaves M.D.   On: 10/04/2014 13:15   Dg Chest  Port 1 View  10/05/2014   CLINICAL DATA:  Acute cardiac failure  EXAM: PORTABLE CHEST - 1 VIEW  COMPARISON:  10/04/2014  FINDINGS: Cardiac shadow is stable but mildly enlarged. Persistent left-sided pleural effusion is noted. Small right-sided pleural effusion is noted as well. Vascular congestion is again noted and stable. No new focal infiltrate is seen.  IMPRESSION: Stable bilateral effusions left greater than right and stable vascular congestion consistent with congestive failure.   Electronically Signed   By: Alcide Clever M.D.   On: 10/05/2014 07:42   Dg Chest Portable 1 View  10/04/2014   CLINICAL DATA:  Shortness of breath for few days, worsening this evening. History of stroke, diabetes, myocardial infarction.  EXAM: PORTABLE CHEST 1 VIEW  COMPARISON:  None.  FINDINGS: The cardiac silhouette is mildly enlarged. Calcified aortic knob. Pulmonary vascular congestion and mild interstitial prominence. Small RIGHT, moderate LEFT pleural effusion, underlying patchy airspace opacity. No pneumothorax. Soft tissue planes and included osseous structure nonsuspicious.  IMPRESSION: Mild cardiomegaly and pulmonary interstitial edema. Moderate LEFT, small RIGHT pleural effusion underlying probable atelectasis or, confluent edema.   Electronically Signed   By: Awilda Metro M.D.   On: 10/04/2014 06:37   Dg Swallowing Func-speech Pathology  10/05/2014    Objective Swallowing Evaluation:    Patient  Details  Name: Rachel Curry MRN: [id number] Date of Birth: [birth year]  Today's Date: 10/05/2014 Time: SLP Start Time (ACUTE ONLY): 1005-SLP Stop Time (ACUTE ONLY): 1030 SLP Time Calculation (min) (ACUTE ONLY): 25 min  Past Medical History:  Past Medical History  Diagnosis Date  . Diabetes mellitus without complication (HCC)   . Hypercholesteremia   . MI, acute, non ST segment elevation [facility])    Past Surgical History: No past surgical history on file. HPI:  Other Pertinent Information: Pt is a 74 year old female patient with PMH  of CAD and and myocardial infarction (heart attack) 27 years ago which  family reports caused cardiogenic shock and anoxic brain injury. Other  history DM, dyslipidemia, and HTN. Reported treated 3 days prior to  admission for acute bronchitis. Husband noted pt was more lethargic and  wheezing. Presented to ED on 10/04/14 with sob, admitted with CHF  exacerbation and UTI. CXR revealed stable bilateral effusions left greater  than right and stable vascular congestion consistent with congestive  failure. Per MD note, pt is mostly nonverbal, verbal responses are  inappropriate. Pt currently NPO, sips with meds.   No Data Recorded  Assessment / Plan / Recommendation CHL IP CLINICAL IMPRESSIONS 10/05/2014  Therapy Diagnosis Mild pharyngeal phase dysphagia  Clinical Impression Pt demonstrated moderate pharyngeal dysphagia  characterized by delayed swallow initiation due to reduced sensation and  impaired timing with intermittently sensed aspiration of thin liquids. Pt  had delayed swallow initiation of thin and nectar liquids, and delayed  epiglottic closure and airway protection with thin liquids. SLP provided  verbal and tactile cues for small sips, but pt was unable to follow  directions. Pt penetrated nectar when swallowing pill when coordinating  multiple consecutive sips with pill. Pt has a moderate aspiration risk due  to reduced respiratory status and decreased airway protection with thin   liquids. SLP recommends nectar liquids, dysphagia 3 (mech soft) solids,  and pills with puree. SLP will f/u with diet tolerance and liquid  upgrades.       CHL IP TREATMENT RECOMMENDATION 10/05/2014  Treatment Recommendations Therapy as outlined in treatment plan below     CHL IP DIET RECOMMENDATION 10/05/2014  SLP Diet Recommendations Dysphagia 3 (Mech soft);Nectar  Liquid Administration via (None)  Medication Administration Whole meds with puree  Compensations Small sips/bites;Slow rate  Postural Changes and/or Swallow Maneuvers (None)     CHL IP OTHER RECOMMENDATIONS 10/05/2014  Recommended Consults (None)  Oral Care Recommendations Oral care BID  Other Recommendations Order thickener from pharmacy     No flowsheet data found.   CHL IP FREQUENCY AND DURATION 10/05/2014  Speech Therapy Frequency (ACUTE ONLY) min 2x/week  Treatment Duration 2 weeks     Pertinent Vitals/Pain NA    SLP Swallow Goals No flowsheet data found.  No flowsheet data found.    CHL IP REASON FOR REFERRAL 10/05/2014  Reason for Referral Objectively evaluate swallowing function     CHL IP ORAL PHASE 10/05/2014  Lips (None)  Tongue (None)  Mucous membranes (None)  Nutritional status (None)  Other (None)  Oxygen therapy (None)  Oral Phase WFL  Oral - Pudding Teaspoon (None)  Oral - Pudding Cup (None)  Oral - Honey Teaspoon (None)  Oral - Honey Cup (None)  Oral - Honey Syringe (None)  Oral - Nectar Teaspoon (None)  Oral - Nectar Cup (None)  Oral - Nectar Straw (None)  Oral - Nectar Syringe (None)  Oral - Ice Chips (None)  Oral - Thin Teaspoon (None)  Oral - Thin Cup (None)  Oral - Thin Straw (None)  Oral - Thin Syringe (None)  Oral - Puree (None)  Oral - Mechanical Soft (None)  Oral - Regular (None)  Oral - Multi-consistency (None)  Oral - Pill (None)  Oral Phase - Comment (None)      CHL IP PHARYNGEAL PHASE 10/05/2014  Pharyngeal Phase Impaired  Pharyngeal - Pudding Teaspoon (None)  Penetration/Aspiration details (pudding teaspoon) (None)  Pharyngeal -  Pudding Cup (None)  Penetration/Aspiration details (pudding cup) (None)  Pharyngeal - Honey Teaspoon (None)  Penetration/Aspiration details (honey teaspoon) (None)  Pharyngeal - Honey Cup (None)  Penetration/Aspiration details (honey cup) (None)  Pharyngeal - Honey Syringe (None)  Penetration/Aspiration details (honey syringe) (None)  Pharyngeal - Nectar Teaspoon (None)  Penetration/Aspiration details (nectar teaspoon) (None)  Pharyngeal - Nectar Cup (None)  Penetration/Aspiration details (nectar cup) (None)  Pharyngeal - Nectar Straw (None)  Penetration/Aspiration details (nectar straw) (None)  Pharyngeal - Nectar Syringe (None)  Penetration/Aspiration details (nectar syringe) (None)  Pharyngeal - Ice Chips (None)  Penetration/Aspiration details (ice chips) (None)  Pharyngeal - Thin Teaspoon (None)  Penetration/Aspiration details (thin teaspoon) (None)  Pharyngeal - Thin Cup (None)  Penetration/Aspiration details (thin cup) (None)  Pharyngeal - Thin Straw (None)  Penetration/Aspiration details (thin straw) (None)  Pharyngeal - Thin Syringe (None)  Penetration/Aspiration details (thin syringe') (None)  Pharyngeal - Puree (None)  Penetration/Aspiration details (puree) (None)  Pharyngeal - Mechanical Soft (None)  Penetration/Aspiration details (mechanical soft) (None)  Pharyngeal - Regular (None)  Penetration/Aspiration details (regular) (None)  Pharyngeal - Multi-consistency (None)  Penetration/Aspiration details (multi-consistency) (None)  Pharyngeal - Pill (None)  Penetration/Aspiration details (pill) (None)  Pharyngeal Comment (None)      CHL IP CERVICAL ESOPHAGEAL PHASE 10/05/2014  Cervical Esophageal Phase WFL  Pudding Teaspoon (None)  Pudding Cup (None)  Honey Teaspoon (None)  Honey Cup (None)  Honey Straw (None)  Nectar Teaspoon (None)  Nectar Cup (None)  Nectar Straw (None)  Nectar Sippy Cup (None)  Thin Teaspoon (None)  Thin Cup (None)  Thin Straw (None)  Thin Sippy Cup (None)  Cervical Esophageal Comment  (None)    No flowsheet data found.  Completed  by Debroah Loop, SLP Student Supervised and reviewed by Harlon Ditty MA CCC-SLP        DeBlois, Riley Nearing 10/05/2014, 12:12 PM     Microbiology: Recent Results (from the past 240 hour(s))  Urine culture     Status: None   Collection Time: 10/04/14  9:40 AM  Result Value Ref Range Status   Specimen Description URINE, CATHETERIZED  Final   Special Requests NONE  Final   Culture >=100,000 COLONIES/mL ESCHERICHIA COLI  Final   Report Status 10/06/2014 FINAL  Final   Organism ID, Bacteria ESCHERICHIA COLI  Final      Susceptibility   Escherichia coli - MIC*    AMPICILLIN >=32 RESISTANT Resistant     CEFAZOLIN <=4 SENSITIVE Sensitive     CEFTRIAXONE <=1 SENSITIVE Sensitive     CIPROFLOXACIN <=0.25 SENSITIVE Sensitive     GENTAMICIN >=16 RESISTANT Resistant     IMIPENEM <=0.25 SENSITIVE Sensitive     NITROFURANTOIN <=16 SENSITIVE Sensitive     TRIMETH/SULFA >=320 RESISTANT Resistant     AMPICILLIN/SULBACTAM 16 INTERMEDIATE Intermediate     PIP/TAZO <=4 SENSITIVE Sensitive     * >=100,000 COLONIES/mL ESCHERICHIA COLI  C difficile quick scan w PCR reflex     Status: None   Collection Time: 10/07/14  9:54 PM  Result Value Ref Range Status   C Diff antigen NEGATIVE NEGATIVE Final   C Diff toxin NEGATIVE NEGATIVE Final   C Diff interpretation Negative for toxigenic C. difficile  Final   Labs: Basic Metabolic Panel:  Recent Labs Lab 10/04/14 0630 10/04/14 1523 10/05/14 0449 10/06/14 0520 10/07/14 0553  NA 133* 138 138 142 139  K 5.1 4.9 4.9 4.5 3.6  CL 106 110 108 110 102  CO2 18* 18* 20* 24 24  GLUCOSE 149* 137* 145* 87 154*  BUN 32* 32* 33* 35* 36*  CREATININE 2.91* 2.99* 3.21* 3.34* 3.31*  CALCIUM 8.3* 8.6* 8.7* 8.8* 8.6*  PHOS  --   --   --  5.7*  --    Liver Function Tests:  Recent Labs Lab 10/06/14 0520  ALBUMIN 2.6*   CBC:  Recent Labs Lab 10/04/14 0630 10/06/14 0520  WBC 9.7 8.9  NEUTROABS 6.9  --     HGB 8.1* 8.9*  HCT 26.3* 27.4*  MCV 101.5* 100.4*  PLT 287 294   Cardiac Enzymes:  Recent Labs Lab 10/04/14 0952 10/04/14 1523 10/04/14 2123  TROPONINI 0.06* 0.06* 0.08*   BNP: BNP (last 3 results)  Recent Labs  10/04/14 0630  BNP 1501.8*    CBG:  Recent Labs Lab 10/07/14 1135 10/07/14 1651 10/07/14 2056 10/08/14 0748 10/08/14 1133  GLUCAP 226* 233* 208* 173* 267*   Signed:  Natasja Niday  [facility] 10/08/2014, 3:18 PM

## 2014-10-08 NOTE — Care Management Important Message (Signed)
Important Message  Patient Details  Name: Rachel Curry MRN: [id number] Date of Birth: [birth year]   Medicare Important Message Given:  Yes-second notification given    Kyla Balzarine 10/08/2014, 11:20 AM

## 2014-10-08 NOTE — Clinical Documentation Improvement (Signed)
Internal Medicine  Can the diagnosis of CKD be further specified?   CKD Stage I - GFR greater than or equal to 90  CKD Stage II - GFR 60-89  CKD Stage III - GFR 30-59  CKD Stage IV - GFR 15-29  CKD Stage V - GFR < 15  ESRD (End Stage Renal Disease)  Other condition  Unable to clinically determine  Supporting Information: : (risk factors, signs and symptoms, diagnostics, treatment) -- GFR: 10/2 15,  10/3 13,  10/4 13,  10/5 13 -- BUN/Cr: 10/2 32/2.99,  10/3 33/3.21,  10/4 35/3.34,  10/5 36/3.31  Please exercise your independent, professional judgment when responding. A specific answer is not anticipated or expected.   Thank You, Beverley Fiedler RN [facility] [place] [phone]

## 2014-10-08 NOTE — Progress Notes (Signed)
Speech Language Pathology Treatment: Dysphagia  Patient Details Name: Rachel Curry MRN: [id number] DOB: 08/27/1940 Today's Date: 10/08/2014 Time: 5621-3086 SLP Time Calculation (min) (ACUTE ONLY): 25 min  Assessment / Plan / Recommendation Clinical Impression  Pt seen with husband at bedside for further education to prepare for d/c on modified diet. SLP offered assistance repositioning pt and instruction for aspiration precautions, s/s of aspiration, plan of care, thickening, and need for f/u with SLP for possible diet upgrade. Pt observed to gag x2 with breakfast though husband reports this is not typical. Advised husband to puree foods at home if any gagging espisodes recur. SLP will f/u as long as pt still admitting. HH SLP to f/u after d/c for repeat MBS readiness and education with family as needed.    HPI Other Pertinent Information: Pt is a 74 year old female patient with PMH of CAD and and myocardial infarction (heart attack) 27 years ago which family reports caused cardiogenic shock and anoxic brain injury. Other history DM, dyslipidemia, and HTN. Reported treated 3 days prior to admission for acute bronchitis. Husband noted pt was more lethargic and wheezing. Presented to ED on 10/04/14 with sob, admitted with CHF exacerbation and UTI. CXR revealed stable bilateral effusions left greater than right and stable vascular congestion consistent with congestive failure. Per MD note, pt is mostly nonverbal, verbal responses are inappropriate. Pt currently NPO, sips with meds.    Pertinent Vitals Pain Assessment: Faces Faces Pain Scale: No hurt  SLP Plan  Continue with current plan of care    Recommendations Diet recommendations: Dysphagia 3 (mechanical soft);Nectar-thick liquid Liquids provided via: Straw;Cup Medication Administration: Whole meds with puree Supervision: Trained caregiver to feed patient;Full supervision/cueing for compensatory strategies Compensations: Small sips/bites;Slow  rate Postural Changes and/or Swallow Maneuvers: Seated upright 90 degrees              Oral Care Recommendations: Oral care BID Follow up Recommendations: Home health SLP Plan: Continue with current plan of care    GO    [facility] MA CCC-SLP  Rachel Curry, Riley Nearing 10/08/2014, 8:36 AM

## 2014-10-08 NOTE — Care Management Note (Signed)
Case Management Note  Patient Details  Name: Rachel Curry MRN: [id number] Date of Birth: Mar 04, 1940  Subjective/Objective:  Pt admitted for Pulmonary Edema. Pt is from home with husband/ additional family support.                   Action/Plan: Pt plan for d/c today. CM was able to speak with husband in regards to [facility] services and he is agreeable to [facility] with [facility]. Referral was made and SOC to begin within 24-48 hrs post d/c. No further needs from CM at this time.    Expected Discharge Date:                  Expected Discharge Plan:  Home w Home Health Services  In-House Referral:  NA  Discharge planning Services  CM Consult  Post Acute Care Choice:  Home Health, Durable Medical Equipment Choice offered to:  Spouse  DME Arranged:  Oxygen DME Agency:  [facility]  HH Arranged:  RN, Speech Therapy HH Agency:  [facility]  Status of Service:  Completed, signed off  Medicare Important Message Given:    Date Medicare IM Given:    Medicare IM give by:    Date Additional Medicare IM Given:    Additional Medicare Important Message give by:     If discussed at Long Length of Stay Meetings, dates discussed:    Additional Comments:  Gala Lewandowsky, RN 10/08/2014, 10:52 AM

## 2014-10-26 ENCOUNTER — Other Ambulatory Visit (HOSPITAL_COMMUNITY): Payer: Self-pay | Admitting: Internal Medicine

## 2014-10-26 DIAGNOSIS — R1310 Dysphagia, unspecified: Secondary | ICD-10-CM

## 2014-11-02 ENCOUNTER — Ambulatory Visit (HOSPITAL_COMMUNITY)
Admission: RE | Admit: 2014-11-02 | Discharge: 2014-11-02 | Disposition: A | Discharge status: Home / Self Care | Payer: Medicare Other | Source: Ambulatory Visit | Attending: Internal Medicine | Admitting: Internal Medicine

## 2014-11-02 DIAGNOSIS — R1313 Dysphagia, pharyngeal phase: Secondary | ICD-10-CM | POA: Diagnosis not present

## 2014-11-02 DIAGNOSIS — R1310 Dysphagia, unspecified: Secondary | ICD-10-CM | POA: Diagnosis present

## 2014-11-27 ENCOUNTER — Inpatient Hospital Stay (HOSPITAL_COMMUNITY)
Admission: EM | Admit: 2014-11-27 | Discharge: 2014-12-01 | DRG: 291 | Disposition: A | Discharge status: Home Health | Payer: Medicare Other | Attending: Internal Medicine | Admitting: Internal Medicine

## 2014-11-27 ENCOUNTER — Emergency Department (HOSPITAL_COMMUNITY): Payer: Medicare Other

## 2014-11-27 ENCOUNTER — Encounter (HOSPITAL_COMMUNITY): Payer: Self-pay | Admitting: *Deleted

## 2014-11-27 DIAGNOSIS — E11649 Type 2 diabetes mellitus with hypoglycemia without coma: Secondary | ICD-10-CM | POA: Diagnosis not present

## 2014-11-27 DIAGNOSIS — J9621 Acute and chronic respiratory failure with hypoxia: Secondary | ICD-10-CM | POA: Diagnosis present

## 2014-11-27 DIAGNOSIS — N179 Acute kidney failure, unspecified: Secondary | ICD-10-CM | POA: Diagnosis present

## 2014-11-27 DIAGNOSIS — E785 Hyperlipidemia, unspecified: Secondary | ICD-10-CM | POA: Diagnosis present

## 2014-11-27 DIAGNOSIS — I130 Hypertensive heart and chronic kidney disease with heart failure and stage 1 through stage 4 chronic kidney disease, or unspecified chronic kidney disease: Principal | ICD-10-CM | POA: Diagnosis present

## 2014-11-27 DIAGNOSIS — Z7902 Long term (current) use of antithrombotics/antiplatelets: Secondary | ICD-10-CM

## 2014-11-27 DIAGNOSIS — J90 Pleural effusion, not elsewhere classified: Secondary | ICD-10-CM

## 2014-11-27 DIAGNOSIS — E1129 Type 2 diabetes mellitus with other diabetic kidney complication: Secondary | ICD-10-CM | POA: Diagnosis present

## 2014-11-27 DIAGNOSIS — Z79899 Other long term (current) drug therapy: Secondary | ICD-10-CM

## 2014-11-27 DIAGNOSIS — D631 Anemia in chronic kidney disease: Secondary | ICD-10-CM | POA: Diagnosis present

## 2014-11-27 DIAGNOSIS — Z8673 Personal history of transient ischemic attack (TIA), and cerebral infarction without residual deficits: Secondary | ICD-10-CM | POA: Diagnosis not present

## 2014-11-27 DIAGNOSIS — I2510 Atherosclerotic heart disease of native coronary artery without angina pectoris: Secondary | ICD-10-CM | POA: Diagnosis present

## 2014-11-27 DIAGNOSIS — R1310 Dysphagia, unspecified: Secondary | ICD-10-CM | POA: Diagnosis present

## 2014-11-27 DIAGNOSIS — Z794 Long term (current) use of insulin: Secondary | ICD-10-CM | POA: Diagnosis not present

## 2014-11-27 DIAGNOSIS — G931 Anoxic brain damage, not elsewhere classified: Secondary | ICD-10-CM | POA: Diagnosis not present

## 2014-11-27 DIAGNOSIS — I5043 Acute on chronic combined systolic (congestive) and diastolic (congestive) heart failure: Secondary | ICD-10-CM | POA: Diagnosis not present

## 2014-11-27 DIAGNOSIS — I10 Essential (primary) hypertension: Secondary | ICD-10-CM | POA: Diagnosis present

## 2014-11-27 DIAGNOSIS — E46 Unspecified protein-calorie malnutrition: Secondary | ICD-10-CM | POA: Diagnosis present

## 2014-11-27 DIAGNOSIS — E1122 Type 2 diabetes mellitus with diabetic chronic kidney disease: Secondary | ICD-10-CM | POA: Diagnosis present

## 2014-11-27 DIAGNOSIS — Z7401 Bed confinement status: Secondary | ICD-10-CM

## 2014-11-27 DIAGNOSIS — L8990 Pressure ulcer of unspecified site, unspecified stage: Secondary | ICD-10-CM | POA: Insufficient documentation

## 2014-11-27 DIAGNOSIS — J9601 Acute respiratory failure with hypoxia: Secondary | ICD-10-CM | POA: Diagnosis present

## 2014-11-27 DIAGNOSIS — I252 Old myocardial infarction: Secondary | ICD-10-CM | POA: Diagnosis not present

## 2014-11-27 DIAGNOSIS — J810 Acute pulmonary edema: Secondary | ICD-10-CM | POA: Diagnosis present

## 2014-11-27 DIAGNOSIS — Z66 Do not resuscitate: Secondary | ICD-10-CM | POA: Diagnosis present

## 2014-11-27 DIAGNOSIS — N189 Chronic kidney disease, unspecified: Secondary | ICD-10-CM | POA: Diagnosis not present

## 2014-11-27 DIAGNOSIS — J811 Chronic pulmonary edema: Secondary | ICD-10-CM | POA: Insufficient documentation

## 2014-11-27 DIAGNOSIS — N390 Urinary tract infection, site not specified: Secondary | ICD-10-CM | POA: Diagnosis present

## 2014-11-27 DIAGNOSIS — N184 Chronic kidney disease, stage 4 (severe): Secondary | ICD-10-CM | POA: Diagnosis present

## 2014-11-27 DIAGNOSIS — Z6829 Body mass index (BMI) 29.0-29.9, adult: Secondary | ICD-10-CM

## 2014-11-27 LAB — TROPONIN I
TROPONIN I: 0.06 ng/mL — AB (ref <0.031)
Troponin I: 0.08 ng/mL — ABNORMAL HIGH

## 2014-11-27 LAB — CBC
HCT: 29.9 % — ABNORMAL LOW (ref 36.0–46.0)
Hemoglobin: 9.3 g/dL — ABNORMAL LOW (ref 12.0–15.0)
MCH: 30.2 pg (ref 26.0–34.0)
MCHC: 31.1 g/dL (ref 30.0–36.0)
MCV: 97.1 fL (ref 78.0–100.0)
PLATELETS: 291 10*3/uL (ref 150–400)
RBC: 3.08 MIL/uL — AB (ref 3.87–5.11)
RDW: 13.5 % (ref 11.5–15.5)
WBC: 9.1 10*3/uL (ref 4.0–10.5)

## 2014-11-27 LAB — I-STAT ARTERIAL BLOOD GAS, ED
Acid-base deficit: 8 mmol/L — ABNORMAL HIGH (ref 0.0–2.0)
Bicarbonate: 17.6 meq/L — ABNORMAL LOW (ref 20.0–24.0)
O2 Saturation: 92 %
Patient temperature: 98.5
TCO2: 19 mmol/L (ref 0–100)
pCO2 arterial: 33.7 mmHg — ABNORMAL LOW (ref 35.0–45.0)
pH, Arterial: 7.327 — ABNORMAL LOW (ref 7.350–7.450)
pO2, Arterial: 66 mmHg — ABNORMAL LOW (ref 80.0–100.0)

## 2014-11-27 LAB — URINALYSIS, ROUTINE W REFLEX MICROSCOPIC
Bilirubin Urine: NEGATIVE
Glucose, UA: 100 mg/dL — AB
KETONES UR: NEGATIVE mg/dL
LEUKOCYTES UA: NEGATIVE
NITRITE: NEGATIVE
Specific Gravity, Urine: 1.011 (ref 1.005–1.030)
pH: 6 (ref 5.0–8.0)

## 2014-11-27 LAB — BASIC METABOLIC PANEL
Anion gap: 6 (ref 5–15)
BUN: 30 mg/dL — AB (ref 6–20)
CHLORIDE: 113 mmol/L — AB (ref 101–111)
CO2: 22 mmol/L (ref 22–32)
CREATININE: 2.98 mg/dL — AB (ref 0.44–1.00)
Calcium: 8.7 mg/dL — ABNORMAL LOW (ref 8.9–10.3)
GFR, EST AFRICAN AMERICAN: 17 mL/min — AB (ref >60)
GFR, EST NON AFRICAN AMERICAN: 14 mL/min — AB (ref >60)
Glucose, Bld: 101 mg/dL — ABNORMAL HIGH (ref 65–99)
POTASSIUM: 5.2 mmol/L — AB (ref 3.5–5.1)
SODIUM: 141 mmol/L (ref 135–145)

## 2014-11-27 LAB — HEPATIC FUNCTION PANEL
ALT: 18 U/L (ref 14–54)
AST: 28 U/L (ref 15–41)
Albumin: 2.8 g/dL — ABNORMAL LOW (ref 3.5–5.0)
Alkaline Phosphatase: 62 U/L (ref 38–126)
Bilirubin, Direct: <0.1 mg/dL — ABNORMAL LOW (ref 0.1–0.5)
Total Bilirubin: 0.5 mg/dL (ref 0.3–1.2)
Total Protein: 5.9 g/dL — ABNORMAL LOW (ref 6.5–8.1)

## 2014-11-27 LAB — URINE MICROSCOPIC-ADD ON

## 2014-11-27 LAB — BRAIN NATRIURETIC PEPTIDE: B Natriuretic Peptide: 2074.9 pg/mL — ABNORMAL HIGH (ref 0.0–100.0)

## 2014-11-27 MED ORDER — LORAZEPAM 1 MG PO TABS
1.0000 mg | ORAL_TABLET | Freq: Two times a day (BID) | ORAL | Status: DC | PRN
  Administered 2014-11-28 – 2014-12-01 (×5): 1 mg via ORAL
  Filled 2014-11-27 (×6): qty 1

## 2014-11-27 MED ORDER — FENOFIBRATE 54 MG PO TABS
54.0000 mg | ORAL_TABLET | Freq: Every day | ORAL | Status: DC
Start: 2014-11-27 — End: 2014-11-27

## 2014-11-27 MED ORDER — FUROSEMIDE 10 MG/ML IJ SOLN
40.0000 mg | Freq: Once | INTRAMUSCULAR | Status: AC
  Administered 2014-11-27: 40 mg via INTRAVENOUS
  Filled 2014-11-27: qty 4

## 2014-11-27 MED ORDER — INSULIN ASPART 100 UNIT/ML ~~LOC~~ SOLN
0.0000 [IU] | Freq: Three times a day (TID) | SUBCUTANEOUS | Status: DC
  Administered 2014-11-28: 2 [IU] via SUBCUTANEOUS
  Administered 2014-11-28 – 2014-11-29 (×2): 5 [IU] via SUBCUTANEOUS
  Administered 2014-11-29: 3 [IU] via SUBCUTANEOUS

## 2014-11-27 MED ORDER — ACETAMINOPHEN 325 MG PO TABS: 650.0000 mg | ORAL_TABLET | ORAL | Status: DC | PRN

## 2014-11-27 MED ORDER — ATORVASTATIN CALCIUM 20 MG PO TABS
20.0000 mg | ORAL_TABLET | Freq: Every day | ORAL | Status: DC
  Administered 2014-11-27 – 2014-12-01 (×5): 20 mg via ORAL
  Filled 2014-11-27 (×6): qty 1

## 2014-11-27 MED ORDER — SODIUM CHLORIDE 0.9 % IV SOLN
250.0000 mL | INTRAVENOUS | Status: DC | PRN
  Administered 2014-11-29: 250 mL via INTRAVENOUS

## 2014-11-27 MED ORDER — ENSURE ENLIVE PO LIQD
237.0000 mL | Freq: Two times a day (BID) | ORAL | Status: DC
  Administered 2014-11-28 – 2014-11-30 (×4): 237 mL via ORAL

## 2014-11-27 MED ORDER — CLOPIDOGREL BISULFATE 75 MG PO TABS
75.0000 mg | ORAL_TABLET | Freq: Every day | ORAL | Status: DC
  Administered 2014-11-27 – 2014-12-01 (×5): 75 mg via ORAL
  Filled 2014-11-27 (×6): qty 1

## 2014-11-27 MED ORDER — FUROSEMIDE 10 MG/ML IJ SOLN
80.0000 mg | Freq: Two times a day (BID) | INTRAMUSCULAR | Status: DC
  Administered 2014-11-27 – 2014-11-29 (×4): 80 mg via INTRAVENOUS
  Filled 2014-11-27 (×4): qty 8

## 2014-11-27 MED ORDER — HEPARIN SODIUM (PORCINE) 5000 UNIT/ML IJ SOLN
5000.0000 [IU] | Freq: Three times a day (TID) | INTRAMUSCULAR | Status: DC
  Administered 2014-11-27 – 2014-12-01 (×11): 5000 [IU] via SUBCUTANEOUS
  Filled 2014-11-27 (×11): qty 1

## 2014-11-27 MED ORDER — CETYLPYRIDINIUM CHLORIDE 0.05 % MT LIQD
7.0000 mL | Freq: Two times a day (BID) | OROMUCOSAL | Status: DC
  Administered 2014-11-27 – 2014-11-30 (×6): 7 mL via OROMUCOSAL

## 2014-11-27 MED ORDER — NITROGLYCERIN IN D5W 200-5 MCG/ML-% IV SOLN
0.0000 ug/min | Freq: Once | INTRAVENOUS | Status: AC
  Administered 2014-11-27: 5 ug/min via INTRAVENOUS
  Filled 2014-11-27: qty 250

## 2014-11-27 MED ORDER — SODIUM CHLORIDE 0.9 % IJ SOLN: 3.0000 mL | INTRAMUSCULAR | Status: DC | PRN

## 2014-11-27 MED ORDER — SODIUM CHLORIDE 0.9 % IJ SOLN
3.0000 mL | Freq: Two times a day (BID) | INTRAMUSCULAR | Status: DC
  Administered 2014-11-27 – 2014-12-01 (×7): 3 mL via INTRAVENOUS

## 2014-11-27 MED ORDER — DEXTROSE 50 % IV SOLN
INTRAVENOUS | Status: AC
  Administered 2014-11-27: 25 mL
  Filled 2014-11-27: qty 50

## 2014-11-27 MED ORDER — INSULIN GLARGINE 100 UNIT/ML ~~LOC~~ SOLN
14.0000 [IU] | Freq: Every day | SUBCUTANEOUS | Status: DC
  Filled 2014-11-27: qty 0.14

## 2014-11-27 MED ORDER — ONDANSETRON HCL 4 MG/2ML IJ SOLN
4.0000 mg | Freq: Four times a day (QID) | INTRAMUSCULAR | Status: DC | PRN
  Administered 2014-11-30 – 2014-12-01 (×2): 4 mg via INTRAVENOUS
  Filled 2014-11-27 (×2): qty 2

## 2014-11-27 MED ORDER — CARVEDILOL 12.5 MG PO TABS: 12.5000 mg | ORAL_TABLET | Freq: Two times a day (BID) | ORAL | Status: DC

## 2014-11-27 MED ORDER — DEXTROSE 5 % IV SOLN
1.0000 g | INTRAVENOUS | Status: DC
  Administered 2014-11-27 – 2014-11-29 (×3): 1 g via INTRAVENOUS
  Filled 2014-11-27 (×4): qty 10

## 2014-11-27 NOTE — ED Notes (Signed)
MD at bedside. 

## 2014-11-27 NOTE — Progress Notes (Signed)
Hypoglycemic Event  CBG: 41  Treatment: D50 IV 25 mL  Symptoms: Pale  Follow-up CBG: Time:2353 CBG Result:84  Possible Reasons for Event: Inadequate meal intake  Comments/MD notified: TRH: Rachel Curry    Reap, [name]

## 2014-11-27 NOTE — ED Provider Notes (Signed)
CSN: [id number]     Arrival date & time 11/27/14  1349 History   First MD Initiated Contact with Patient 11/27/14 1429     No chief complaint on file.    (Consider location/radiation/quality/duration/timing/severity/associated sxs/prior Treatment) HPI   This is a 74 year old female patient with history of CAD and MI 27 years ago that apparently resulted in cardiogenic shock and anoxic brain injury, diabetes on insulin, dyslipidemia, hypertension who presents to the ER with shortness of breath. Patient was recently admitted to the hospital for flash pulmonary edema and acute heart failure exacerbation. At baseline patient is noncommunicating. Level 5 caveat, anoxic brain injury. Patient's husband is with her who is her primary caregiver and provides history for the patient. He states that over the last 3-4 days patient has been having increased work of breathing with wheezing. He states the patient appears more pale than normal. He checked her oxygen level at home with home pulse oximetry and it was ranging between 70 and 91 over the last week. He also states that patient accidentally leaned against a wall 4 days ago and bumped her head. Patient has a large bruise to the left temporal region. Patient is on blood thinners.   Past Medical History  Diagnosis Date  . Diabetes mellitus without complication (HCC)   . Hypercholesteremia   . MI, acute, non ST segment elevation [facility])    Past Surgical History  Procedure Laterality Date  . Abdominal hysterectomy     No family history on file. Social History  Substance Use Topics  . Smoking status: Never Smoker   . Smokeless tobacco: None  . Alcohol Use: No   OB History    No data available     Review of Systems  Unable to perform ROS: Other  anoxic brain injury 20 years ago, consequential mental handicap.    Allergies  Macrodantin and Azithromycin  Home Medications   Prior to Admission medications   Medication Sig Start Date End  Date Taking? Authorizing Provider  atorvastatin (LIPITOR) 20 MG tablet Take 20 mg by mouth daily. 07/15/14  Yes Historical Provider, MD  clopidogrel (PLAVIX) 75 MG tablet Take 75 mg by mouth daily. 07/15/14  Yes Historical Provider, MD  fenofibrate (TRICOR) 145 MG tablet Take 145 mg by mouth daily. 07/15/14  Yes Historical Provider, MD  insulin aspart (NOVOLOG) 100 UNIT/ML injection Inject 10 Units into the skin every evening. Only when CBG over 130   Yes Historical Provider, MD  insulin glargine (LANTUS) 100 UNIT/ML injection Inject 14 Units into the skin at bedtime.   Yes Historical Provider, MD  insulin lispro protamine-lispro (HUMALOG 75/25 MIX) (75-25) 100 UNIT/ML SUSP injection Inject 20 Units into the skin daily with breakfast.   Yes Historical Provider, MD  omeprazole (PRILOSEC) 20 MG capsule Take 20 mg by mouth daily.   Yes Historical Provider, MD  carvedilol (COREG) 12.5 MG tablet Take 1 tablet (12.5 mg total) by mouth 2 (two) times daily with a meal. Patient not taking: Reported on 11/27/2014 10/08/14   Costin Otelia Sergeant, MD  cephALEXin (KEFLEX) 500 MG capsule Take 1 capsule (500 mg total) by mouth 2 (two) times daily. Patient not taking: Reported on 11/27/2014 10/08/14   Leatha Gilding, MD  furosemide (LASIX) 40 MG tablet Take 1 tablet (40 mg total) by mouth daily. Patient not taking: Reported on 11/27/2014 10/08/14   Leatha Gilding, MD  LORazepam (ATIVAN) 1 MG tablet Take 1 mg by mouth 2 (two) times daily as needed.  anxiety 08/07/14   Historical Provider, MD   BP 170/129 mmHg  Pulse 91  Temp(Src) 97.7 F (36.5 C) (Oral)  Resp 29  SpO2 97% Physical Exam  Constitutional: She appears well-developed and well-nourished. She appears distressed.  HENT:  Head: Normocephalic and atraumatic.  Mouth/Throat: Oropharynx is clear and moist. No oropharyngeal exudate.  Eyes: Conjunctivae and EOM are normal. Right eye exhibits no discharge. Left eye exhibits no discharge. No scleral icterus.   Miosis present  Neck: Neck supple. No JVD present. No tracheal deviation present.  Cardiovascular: Normal rate, regular rhythm, normal heart sounds and intact distal pulses.  Exam reveals no gallop and no friction rub.   No murmur heard. Pulmonary/Chest: No stridor. No respiratory distress. She has wheezes. She has no rales. She exhibits no tenderness.  Diffuse wheezing on exam. Tachypnea. Pursed lips.   Pt will desaturate when laying flat. Pt now sitting up in bed.  Abdominal: Soft. She exhibits no distension. There is no tenderness. There is no guarding.  Musculoskeletal: Normal range of motion. She exhibits edema ( bilateral 2+ pitting edema in LE).  Lymphadenopathy:    She has no cervical adenopathy.  Neurological: She is alert. No cranial nerve deficit. She exhibits normal muscle tone. Coordination normal.  Pt non-communicating at baseline due to hx of anoxic brain injury. Hx of stroke with residual R sided paralysis.  Skin: Skin is warm and dry. No rash noted. She is not diaphoretic. No erythema. There is pallor.  Nursing note and vitals reviewed.   ED Course  Procedures (including critical care time) Labs Review Labs Reviewed  BASIC METABOLIC PANEL - Abnormal; Notable for the following:    Potassium 5.2 (*)    Chloride 113 (*)    Glucose, Bld 101 (*)    BUN 30 (*)    Creatinine, Ser 2.98 (*)    Calcium 8.7 (*)    GFR calc non Af Amer 14 (*)    GFR calc Af Amer 17 (*)    All other components within normal limits  CBC - Abnormal; Notable for the following:    RBC 3.08 (*)    Hemoglobin 9.3 (*)    HCT 29.9 (*)    All other components within normal limits  BRAIN NATRIURETIC PEPTIDE - Abnormal; Notable for the following:    B Natriuretic Peptide 2074.9 (*)    All other components within normal limits  TROPONIN I - Abnormal; Notable for the following:    Troponin I 0.08 (*)    All other components within normal limits  HEPATIC FUNCTION PANEL - Abnormal; Notable for the  following:    Total Protein 5.9 (*)    Albumin 2.8 (*)    Bilirubin, Direct <0.1 (*)    All other components within normal limits  I-STAT ARTERIAL BLOOD GAS, ED - Abnormal; Notable for the following:    pH, Arterial 7.327 (*)    pCO2 arterial 33.7 (*)    pO2, Arterial 66.0 (*)    Bicarbonate 17.6 (*)    Acid-base deficit 8.0 (*)    All other components within normal limits  URINALYSIS, ROUTINE W REFLEX MICROSCOPIC (NOT AT [facility])  CBG MONITORING, ED    Imaging Review Dg Chest Portable 1 View  11/27/2014  CLINICAL DATA:  Shortness of breath. EXAM: PORTABLE CHEST 1 VIEW COMPARISON:  10/04/2014 FINDINGS: Again noted is a left pleural effusion which is at least moderate in size. Left effusion appears to be slightly larger than the previous chest radiograph. Evidence  for a small right pleural effusion. Prominent interstitial lung markings are suggestive for pulmonary edema. Heart size is slightly prominent and unchanged from the previous examination. Atherosclerotic calcifications at the aortic arch. IMPRESSION: Pulmonary edema with bilateral pleural effusions, left side greater than right. Electronically Signed   By: Richarda Overlie M.D.   On: 11/27/2014 15:08   I have personally reviewed and evaluated these images and lab results as part of my medical decision-making.   EKG Interpretation None      MDM   Final diagnoses:  Acute pulmonary edema (HCC)   Patient with history of MI with cardiogenic shock resulting in anoxic brain injury, stroke, flash pulmonary edema, congestive heart failure presents for increased shortness of breath and wheezing. On exam the patient has diffuse wheezing, tachypnea, peripheral edema left greater than right. Patient with pallor. On exam she does have pitting edema bilaterally, lungs are wheezy, decreased lung sounds at the bases. Pt saturating 88% on RA, placed on 2L O2 nasal cannula. Now saturating at 100%. Patient appears to be fluid overloaded. We'll administer  80 mg IV Lasix as well as placed on a nitroglycerin drip. Do not believe the patient will require BiPAP at this time. Patient family feels the patient may not respond well to having a mask placed on her face. Also considering CT of the head as patient has large bruise to left temporal area and patient is on blood thinners. However the injury causing this bruise occurred 4 days ago.  Patient not able to be transferred to CT at this time as she will desaturate if she lays flat.  Chest x-ray reveals pulmonary edema with bilateral pleural effusions, left side greater than right. Troponin elevated to 0.08. This is chronically elevated in this patient. BNP greater than 2000. Potassium elevated at 5.2. Creatinine and BUN elevated as well. Creatinine 2.98. Hemoglobin 9.3 which is stable for the patient compared to previous hemoglobin levels.  After administration of nitroglycerin drip and 80 mg IV Lasix. Patient appears to be clinically improved. Lips no longer pursed. Wheezing has improved. Tachypnea has resolved now with normal respiratory rate at 16. All other vital signs are stable. Will speak with hospitalist for admission for continued diuresis and observation.  Dr. Cyndie Chime spoke with the hospitalist who will admit patient to their service in a stepdown bed.    Lester Kinsman [place], PA-C 11/28/14 1137  Leta Baptist, MD 11/29/14 1054  Leta Baptist, MD 11/29/14 1055

## 2014-11-27 NOTE — H&P (Signed)
History and Physical    Rachel Curry ZOX:[id number] DOB: [birth year] DOA: 11/27/2014  Referring physician: Dr. Cyndie ChimeNguyen PCP: Gaspar Garbeichard W Tisovec, MD  Specialists: none  Chief Complaint: shortness of breath  HPI: Rachel KindsJoann Curry is a 74 y.o. female has a past medical history significant for Chronic CHF, HTN, HLD, anoxic brain injury, DM, CKD IV, presents to the hospital with progressive shortness of breath for the past week. Patient is non verbal and history is obtained from her husband. He noticed that for the past week she has been struggling to breathe and appeared more pale than normal. She also had what sounds like a pre-syncopal episode. She was hospitalized with a similar presentation a month ago, and she was placed on Lasix on discharge, however they finished her prescription and did not get it refilled. Husband checked her oxygen level at home and found to be in the 70s. In the ED patient's CXR showed pulmonary edema, she was given IV Lasix. ABG showed mild hypoxia. Her BNP is elevated to 2000 and has a troponin elevation to 0.08. TRH asked for admission for acute on chronic heart failure.  Review of Systems: unable to obtain ROS due to mental status  Past Medical History  Diagnosis Date  . Diabetes mellitus without complication (HCC)   . Hypercholesteremia   . MI, acute, non ST segment elevation [facility](HCC)    Past Surgical History  Procedure Laterality Date  . Abdominal hysterectomy     Social History:  reports that she has never smoked. She does not have any smokeless tobacco history on file. She reports that she does not drink alcohol or use illicit drugs.  Allergies  Allergen Reactions  . Macrodantin [Nitrofurantoin Macrocrystal] Shortness Of Breath  . Azithromycin Diarrhea   Unable to obtain family history due to mental status   Prior to Admission medications   Medication Sig Start Date End Date Taking? Authorizing Provider  atorvastatin (LIPITOR) 20 MG tablet Take 20 mg by mouth  daily. 07/15/14  Yes Historical Provider, MD  clopidogrel (PLAVIX) 75 MG tablet Take 75 mg by mouth daily. 07/15/14  Yes Historical Provider, MD  fenofibrate (TRICOR) 145 MG tablet Take 145 mg by mouth daily. 07/15/14  Yes Historical Provider, MD  insulin aspart (NOVOLOG) 100 UNIT/ML injection Inject 10 Units into the skin every evening. Only when CBG over 130   Yes Historical Provider, MD  insulin glargine (LANTUS) 100 UNIT/ML injection Inject 14 Units into the skin at bedtime.   Yes Historical Provider, MD  insulin lispro protamine-lispro (HUMALOG 75/25 MIX) (75-25) 100 UNIT/ML SUSP injection Inject 20 Units into the skin daily with breakfast.   Yes Historical Provider, MD  omeprazole (PRILOSEC) 20 MG capsule Take 20 mg by mouth daily.   Yes Historical Provider, MD  carvedilol (COREG) 12.5 MG tablet Take 1 tablet (12.5 mg total) by mouth 2 (two) times daily with a meal. Patient not taking: Reported on 11/27/2014 10/08/14   Costin Otelia SergeantM Gherghe, MD  cephALEXin (KEFLEX) 500 MG capsule Take 1 capsule (500 mg total) by mouth 2 (two) times daily. Patient not taking: Reported on 11/27/2014 10/08/14   Leatha Gildingostin M Gherghe, MD  furosemide (LASIX) 40 MG tablet Take 1 tablet (40 mg total) by mouth daily. Patient not taking: Reported on 11/27/2014 10/08/14   Leatha Gildingostin M Gherghe, MD  LORazepam (ATIVAN) 1 MG tablet Take 1 mg by mouth 2 (two) times daily as needed. anxiety 08/07/14   Historical Provider, MD   Physical Exam: Filed Vitals:  11/27/14 1545 11/27/14 1600 11/27/14 1645 11/27/14 1715  BP: 165/89 160/83 155/70 152/69  Pulse: 87 85 82 80  Temp:      TempSrc:      Resp: 32 19  16  SpO2: 99% 99% 96% 95%     GENERAL: NAD, pale  HEENT: head NCAT, no scleral icterus. Pupils round and reactive.   NECK: Supple.   LUNGS: bibasilar crackles, no wheezing  HEART: Regular rate and rhythm without murmur. 2+ pulses, no JVD, 1-2+ peripheral edema  ABDOMEN: Soft, nontender, and nondistended. Positive bowel sounds. No  hepatosplenomegaly was noted.  EXTREMITIES: Without any cyanosis, clubbing, rash, lesions  NEUROLOGIC: not following commands    Labs on Admission:  Basic Metabolic Panel:  Recent Labs Lab 11/27/14 1431  NA 141  K 5.2*  CL 113*  CO2 22  GLUCOSE 101*  BUN 30*  CREATININE 2.98*  CALCIUM 8.7*   Liver Function Tests:  Recent Labs Lab 11/27/14 1514  AST 28  ALT 18  ALKPHOS 62  BILITOT 0.5  PROT 5.9*  ALBUMIN 2.8*   CBC:  Recent Labs Lab 11/27/14 1431  WBC 9.1  HGB 9.3*  HCT 29.9*  MCV 97.1  PLT 291   Cardiac Enzymes:  Recent Labs Lab 11/27/14 1514  TROPONINI 0.08*    BNP (last 3 results)  Recent Labs  10/04/14 0630 11/27/14 1431  BNP 1501.8* 2074.9*    Radiological Exams on Admission: Dg Chest Portable 1 View  11/27/2014  CLINICAL DATA:  Shortness of breath. EXAM: PORTABLE CHEST 1 VIEW COMPARISON:  10/04/2014 FINDINGS: Again noted is a left pleural effusion which is at least moderate in size. Left effusion appears to be slightly larger than the previous chest radiograph. Evidence for a small right pleural effusion. Prominent interstitial lung markings are suggestive for pulmonary edema. Heart size is slightly prominent and unchanged from the previous examination. Atherosclerotic calcifications at the aortic arch. IMPRESSION: Pulmonary edema with bilateral pleural effusions, left side greater than right. Electronically Signed   By: Richarda Overlie M.D.   On: 11/27/2014 15:08    EKG: Independently reviewed. Sinus rhythm  Assessment/Plan Active Problems:   Acute respiratory failure with hypoxia (HCC)   DM (diabetes mellitus), type 2 with renal complications (HCC)   Anoxic brain damage (HCC)   Anemia in chronic kidney disease   HLD (hyperlipidemia)   CAD (coronary artery disease)   History of MI (myocardial infarction)   Acute pulmonary edema (HCC)   Acute renal failure (ARF) (HCC)   HTN (hypertension)   UTI (urinary tract infection)   Chronic  kidney disease (CKD), stage IV (severe) (HCC)   Pulmonary edema   Acute on chronic combined systolic (congestive) and diastolic (congestive) heart failure (HCC)   Acute on chronic combined systolic and diastolic heart failure with acute pulmonary edema - likely due to not being on Lasix - start 80 mg IV Lasix BID - strict I&O, daily weights - echo last month with EF 45-50% - no ACEI/ARB due to renal failure  Acute on chronic hypoxic respiratory failure - oxygen as needed, wean off as tolerated  Acute on chronic renal failure, CKD stage IV - Cr close to baseline, monitor with diuresis  HTN - resume home medications  UTI - will send urine cultures, start Ceftriaxone  DM, type 2 with renal complications - Lantus and SSI  HLD - resume home medication  CAD - continue plavix  Elevated troponin - likely demand, cycle    Diet: dysphagia 3 nectar  thick per last SLP evaluation Fluids: none DVT Prophylaxis: heparin  Code Status: DNR  Family Communication: d/w husband bedside  Disposition Plan: admit to SDU     Costin M. Elvera Lennox, MD [facility] Pager [phone]  If 7PM-7AM, please contact night-coverage [email or web address] Password [facility] 11/27/2014, 5:19 PM

## 2014-11-27 NOTE — ED Notes (Signed)
Pt was here in October and had pulmonary edema/CHF.  PT lethargic and weak with sob.  Pt appears pale, normally does not communicate.  PT has bruising to left forehead from previous injury.

## 2014-11-28 LAB — GLUCOSE, CAPILLARY
GLUCOSE-CAPILLARY: 191 mg/dL — AB (ref 65–99)
GLUCOSE-CAPILLARY: 69 mg/dL (ref 65–99)
GLUCOSE-CAPILLARY: 84 mg/dL (ref 65–99)
Glucose-Capillary: 41 mg/dL — CL (ref 65–99)
Glucose-Capillary: 87 mg/dL (ref 65–99)
Glucose-Capillary: 292 mg/dL — ABNORMAL HIGH (ref 65–99)
Glucose-Capillary: 257 mg/dL — ABNORMAL HIGH (ref 65–99)

## 2014-11-28 LAB — BASIC METABOLIC PANEL
ANION GAP: 8 (ref 5–15)
BUN: 30 mg/dL — ABNORMAL HIGH (ref 6–20)
CALCIUM: 8.6 mg/dL — AB (ref 8.9–10.3)
CO2: 21 mmol/L — AB (ref 22–32)
CREATININE: 2.97 mg/dL — AB (ref 0.44–1.00)
Chloride: 115 mmol/L — ABNORMAL HIGH (ref 101–111)
GFR, EST AFRICAN AMERICAN: 17 mL/min — AB (ref >60)
GFR, EST NON AFRICAN AMERICAN: 15 mL/min — AB (ref >60)
Glucose, Bld: 63 mg/dL — ABNORMAL LOW (ref 65–99)
Potassium: 4.2 mmol/L (ref 3.5–5.1)
Sodium: 144 mmol/L (ref 135–145)

## 2014-11-28 LAB — MRSA PCR SCREENING: MRSA BY PCR: NEGATIVE

## 2014-11-28 LAB — TROPONIN I: Troponin I: 0.07 ng/mL — ABNORMAL HIGH (ref <0.031)

## 2014-11-28 MED ORDER — DEXTROSE-NACL 5-0.45 % IV SOLN
INTRAVENOUS | Status: DC
  Administered 2014-11-28: 01:00:00 via INTRAVENOUS

## 2014-11-28 MED ORDER — INSULIN GLARGINE 100 UNIT/ML ~~LOC~~ SOLN
4.0000 [IU] | Freq: Every day | SUBCUTANEOUS | Status: DC
  Administered 2014-11-28: 4 [IU] via SUBCUTANEOUS
  Filled 2014-11-28 (×2): qty 0.04

## 2014-11-28 NOTE — Progress Notes (Addendum)
PROGRESS NOTE  Rachel Curry ZOX:[id number] DOB: [birth year] DOA: 11/27/2014 PCP: Gaspar Garbe, MD   HPI: 74 y.o. female has a past medical history significant for Chronic CHF, HTN, HLD, anoxic brain injury, DM, CKD IV, presents to the hospital with progressive shortness of breath   Subjective / 24 H Interval events - non verbal, clinically appears better  Assessment/Plan: Active Problems:   Acute respiratory failure with hypoxia (HCC)   DM (diabetes mellitus), type 2 with renal complications (HCC)   Anoxic brain damage (HCC)   Anemia in chronic kidney disease   HLD (hyperlipidemia)   CAD (coronary artery disease)   History of MI (myocardial infarction)   Acute pulmonary edema (HCC)   Acute renal failure (ARF) (HCC)   HTN (hypertension)   UTI (urinary tract infection)   Chronic kidney disease (CKD), stage IV (severe) (HCC)   Pulmonary edema   Acute on chronic combined systolic (congestive) and diastolic (congestive) heart failure (HCC)   Acute on chronic combined systolic and diastolic heart failure with acute pulmonary edema - good urine output overnight, renal function stable - net negative 3L so far, weights unreliable since patient is bedbound  - strict I&O, daily weights - echo last month with EF 45-50% - no ACEI/ARB due to renal failure  Acute on chronic hypoxic respiratory failure - oxygen as needed, wean off as tolerated. Was on room air this morning, comfortable  Acute on chronic renal failure, CKD stage IV - Cr close to baseline, monitor with diuresis, stable so far  HTN - resume home medications, BP still elevated this morning to 159/87  UTI - will send urine cultures, started Ceftriaxone  DM, type 2 with renal complications - Lantus and SSI - hypoglycemic overnight, reduce Lantus  HLD - resume home medication,   CAD - continue plavix  Elevated troponin - likely demand, flat trent   Diet: Diet Heart Room service appropriate?: Yes; Fluid  consistency:: Thin Fluids: none DVT Prophylaxis: heparin  Code Status: DNR Family Communication: d/w husband bedside  Disposition Plan: home when stable  Barriers to discharge: IV diuresis  Consultants:  None   Procedures:  None    Antibiotics  Anti-infectives    Start     Dose/Rate Route Frequency Ordered Stop   11/27/14 1730  cefTRIAXone (ROCEPHIN) 1 g in dextrose 5 % 50 mL IVPB     1 g 100 mL/hr over 30 Minutes Intravenous Every 24 hours 11/27/14 1729         Studies  Dg Chest Portable 1 View  11/27/2014  CLINICAL DATA:  Shortness of breath. EXAM: PORTABLE CHEST 1 VIEW COMPARISON:  10/04/2014 FINDINGS: Again noted is a left pleural effusion which is at least moderate in size. Left effusion appears to be slightly larger than the previous chest radiograph. Evidence for a small right pleural effusion. Prominent interstitial lung markings are suggestive for pulmonary edema. Heart size is slightly prominent and unchanged from the previous examination. Atherosclerotic calcifications at the aortic arch. IMPRESSION: Pulmonary edema with bilateral pleural effusions, left side greater than right. Electronically Signed   By: Richarda Overlie M.D.   On: 11/27/2014 15:08    Objective  Filed Vitals:   11/28/14 0300 11/28/14 0400 11/28/14 0500 11/28/14 0600  BP: 129/87 110/88 110/89 119/90  Pulse:      Temp:  99.1 F (37.3 C)  99.1 F Anaise Sterbenz)  TempSrc:  Rectal  Rectal  Resp: Height:      Weight:  69.9 kg (154 lb 1.6 oz)   SpO2:  98%      Intake/Output Summary (Last 24 hours) at 11/28/14 0706 Last data filed at 11/28/14 0656  Gross per 24 hour  Intake 195.17 ml  Output   3325 ml  Net -3129.83 ml   Filed Weights   11/27/14 1827 11/28/14 0500  Weight: 68.9 kg (151 lb 14.4 oz) 69.9 kg (154 lb 1.6 oz)    Exam:  GENERAL: NAD, non verbal  HEENT: head NCAT, no scleral icterus.   NECK: Supple. No LAD  LUNGS: Clear to auscultation. No wheezing or  crackles  HEART: Regular rate and rhythm without murmur. 2+ pulses, no JVD, no peripheral edema  ABDOMEN: Soft, non-distended, non-tender. Positive bowel sounds.  NEUROLOGIC: non verbal, intermittently following commands.   Data Reviewed: Basic Metabolic Panel:  Recent Labs Lab 11/27/14 1431 11/28/14 0455  NA 141 144  K 5.2* 4.2  CL 113* 115*  CO2 22 21*  GLUCOSE 101* 63*  BUN 30* 30*  CREATININE 2.98* 2.97*  CALCIUM 8.7* 8.6*   Liver Function Tests:  Recent Labs Lab 11/27/14 1514  AST 28  ALT 18  ALKPHOS 62  BILITOT 0.5  PROT 5.9*  ALBUMIN 2.8*   CBC:  Recent Labs Lab 11/27/14 1431  WBC 9.1  HGB 9.3*  HCT 29.9*  MCV 97.1  PLT 291   Cardiac Enzymes:  Recent Labs Lab 11/27/14 1514 11/27/14 1915 11/28/14 0455  TROPONINI 0.08* 0.06* 0.07*   BNP (last 3 results)  Recent Labs  10/04/14 0630 11/27/14 1431  BNP 1501.8* 2074.9*   CBG:  Recent Labs Lab 11/27/14 2224 11/27/14 2228 11/27/14 2353 11/28/14 0603 11/28/14 0653  GLUCAP 37* 41* 84 69 87    Recent Results (from the past 240 hour(s))  MRSA PCR Screening     Status: None   Collection Time: 11/27/14  7:26 PM  Result Value Ref Range Status   MRSA by PCR NEGATIVE NEGATIVE Final    Comment:        The GeneXpert MRSA Assay (FDA approved for NASAL specimens only), is one component of a comprehensive MRSA colonization surveillance program. It is not intended to diagnose MRSA infection nor to guide or monitor treatment for MRSA infections.      Scheduled Meds: . antiseptic oral rinse  7 mL Mouth Rinse BID  . atorvastatin  20 mg Oral Daily  . cefTRIAXone (ROCEPHIN)  IV  1 g Intravenous Q24H  . clopidogrel  75 mg Oral Daily  . feeding supplement (ENSURE ENLIVE)  237 mL Oral BID BM  . furosemide  80 mg Intravenous Q12H  . heparin  5,000 Units Subcutaneous 3 times per day  . insulin aspart  0-9 Units Subcutaneous TID WC  . insulin glargine  4 Units Subcutaneous QHS  . sodium  chloride  3 mL Intravenous Q12H   Continuous Infusions: . dextrose 5 % and 0.45% NaCl 10 mL/hr at 11/28/14 0044     Pamella Pertostin Jinny Sweetland, MD [facility] Pager [phone]. If 7 PM - 7 AM, please contact night-coverage at [email or web address], password [email or web address] 11/28/2014, 7:06 AM  LOS: 1 day

## 2014-11-28 NOTE — Progress Notes (Signed)
Nutrition Brief Note  Patient identified on the Malnutrition Screening Tool (MST) Report Pt from home and lives with husband who is primary caregiver.  Body mass index is 29.13 kg/(m^2). Patient meets criteria for overweight based on current BMI. Weight gain noted 5.4% in 1 month which is significant. Hx includes chronic CHF, HTN , anoxic brain injury and swallow difficulty. Son and 2 daughters are present and assisted with nutrition hx.  Current diet order is dysphagia 3 with Nectar thick liquids, patient is consuming approximately 50-75% of meals at this time per family. Ms Brooke DareKing has very good appetite and usually doesn't need to drink ONS. According to family she was recently upgraded to allow thin liquids but husband keeps thickened product on hand.  Labs and medications reviewed. Pt is receiving Ensure BID 350 kcal, 20 gr protein) each.   No additional nutrition interventions warranted at this time.  Royann ShiversLynn Charle Clear MS,RD,CSG,LDN Office: [phone] Pager: [phone]

## 2014-11-28 NOTE — Progress Notes (Signed)
Hypoglycemic Event  CBG: 69  Treatment: 15 GM carbohydrate snack  Symptoms: None  Follow-up CBG: [id number] CBG Result:87  Possible Reasons for Event: Inadequate meal intake  Comments/MD notified:    Reap, Randon GoldsmithRebecca Curry

## 2014-11-29 DIAGNOSIS — G931 Anoxic brain damage, not elsewhere classified: Secondary | ICD-10-CM

## 2014-11-29 DIAGNOSIS — L8990 Pressure ulcer of unspecified site, unspecified stage: Secondary | ICD-10-CM | POA: Insufficient documentation

## 2014-11-29 DIAGNOSIS — N390 Urinary tract infection, site not specified: Secondary | ICD-10-CM

## 2014-11-29 DIAGNOSIS — J9601 Acute respiratory failure with hypoxia: Secondary | ICD-10-CM

## 2014-11-29 DIAGNOSIS — I2510 Atherosclerotic heart disease of native coronary artery without angina pectoris: Secondary | ICD-10-CM

## 2014-11-29 DIAGNOSIS — N189 Chronic kidney disease, unspecified: Secondary | ICD-10-CM

## 2014-11-29 DIAGNOSIS — D631 Anemia in chronic kidney disease: Secondary | ICD-10-CM

## 2014-11-29 DIAGNOSIS — I5043 Acute on chronic combined systolic (congestive) and diastolic (congestive) heart failure: Secondary | ICD-10-CM

## 2014-11-29 DIAGNOSIS — E1122 Type 2 diabetes mellitus with diabetic chronic kidney disease: Secondary | ICD-10-CM

## 2014-11-29 DIAGNOSIS — E785 Hyperlipidemia, unspecified: Secondary | ICD-10-CM

## 2014-11-29 LAB — BASIC METABOLIC PANEL
ANION GAP: 8 (ref 5–15)
BUN: 33 mg/dL — ABNORMAL HIGH (ref 6–20)
CHLORIDE: 107 mmol/L (ref 101–111)
CO2: 23 mmol/L (ref 22–32)
Calcium: 8.5 mg/dL — ABNORMAL LOW (ref 8.9–10.3)
Creatinine, Ser: 3.01 mg/dL — ABNORMAL HIGH (ref 0.44–1.00)
GFR calc non Af Amer: 14 mL/min — ABNORMAL LOW (ref >60)
GFR, EST AFRICAN AMERICAN: 17 mL/min — AB (ref >60)
Glucose, Bld: 288 mg/dL — ABNORMAL HIGH (ref 65–99)
Potassium: 4.7 mmol/L (ref 3.5–5.1)
SODIUM: 138 mmol/L (ref 135–145)

## 2014-11-29 LAB — GLUCOSE, CAPILLARY
GLUCOSE-CAPILLARY: 257 mg/dL — AB (ref 65–99)
Glucose-Capillary: 235 mg/dL — ABNORMAL HIGH (ref 65–99)
Glucose-Capillary: 262 mg/dL — ABNORMAL HIGH (ref 65–99)
Glucose-Capillary: 271 mg/dL — ABNORMAL HIGH (ref 65–99)
Glucose-Capillary: 274 mg/dL — ABNORMAL HIGH (ref 65–99)

## 2014-11-29 LAB — CBC
HEMATOCRIT: 29.1 % — AB (ref 36.0–46.0)
HEMOGLOBIN: 9.4 g/dL — AB (ref 12.0–15.0)
MCH: 31.1 pg (ref 26.0–34.0)
MCHC: 32.3 g/dL (ref 30.0–36.0)
MCV: 96.4 fL (ref 78.0–100.0)
Platelets: 274 10*3/uL (ref 150–400)
RBC: 3.02 MIL/uL — ABNORMAL LOW (ref 3.87–5.11)
RDW: 13.7 % (ref 11.5–15.5)
WBC: 9.7 10*3/uL (ref 4.0–10.5)

## 2014-11-29 MED ORDER — CARVEDILOL 6.25 MG PO TABS
6.2500 mg | ORAL_TABLET | Freq: Two times a day (BID) | ORAL | Status: DC
  Administered 2014-11-29 – 2014-11-30 (×4): 6.25 mg via ORAL
  Filled 2014-11-29 (×4): qty 1

## 2014-11-29 MED ORDER — INSULIN ASPART 100 UNIT/ML ~~LOC~~ SOLN
0.0000 [IU] | Freq: Every day | SUBCUTANEOUS | Status: DC
  Administered 2014-11-29: 3 [IU] via SUBCUTANEOUS
  Administered 2014-11-30: 2 [IU] via SUBCUTANEOUS

## 2014-11-29 MED ORDER — RESOURCE THICKENUP CLEAR PO POWD
ORAL | Status: DC | PRN
  Filled 2014-11-29: qty 125

## 2014-11-29 MED ORDER — ISOSORBIDE MONONITRATE ER 30 MG PO TB24
30.0000 mg | ORAL_TABLET | Freq: Every day | ORAL | Status: DC
  Administered 2014-11-29 – 2014-12-01 (×3): 30 mg via ORAL
  Filled 2014-11-29 (×3): qty 1

## 2014-11-29 MED ORDER — FUROSEMIDE 10 MG/ML IJ SOLN: 80.0000 mg | Freq: Every day | INTRAMUSCULAR | Status: DC

## 2014-11-29 MED ORDER — INSULIN GLARGINE 100 UNIT/ML ~~LOC~~ SOLN
8.0000 [IU] | Freq: Every day | SUBCUTANEOUS | Status: DC
  Administered 2014-11-29: 8 [IU] via SUBCUTANEOUS
  Filled 2014-11-29 (×2): qty 0.08

## 2014-11-29 MED ORDER — INSULIN ASPART 100 UNIT/ML ~~LOC~~ SOLN
0.0000 [IU] | Freq: Three times a day (TID) | SUBCUTANEOUS | Status: DC
  Administered 2014-11-29 – 2014-11-30 (×2): 8 [IU] via SUBCUTANEOUS
  Administered 2014-11-30: 5 [IU] via SUBCUTANEOUS
  Administered 2014-11-30: 2 [IU] via SUBCUTANEOUS

## 2014-11-29 NOTE — Progress Notes (Signed)
Nitro gtt increased to 13.5 ml/hr

## 2014-11-29 NOTE — Progress Notes (Signed)
Nitro gtt titrated up to 10.5 ml/hr

## 2014-11-29 NOTE — Progress Notes (Signed)
Called report to 3E.  All questions answered. 

## 2014-11-29 NOTE — Progress Notes (Signed)
[facility] Progress Note    Patient: Rachel Curry     ZOX:[id number]  DOB: 1940/07/30     DOA: 11/27/2014 Date of Service: the patient was seen and examined on 11/29/2014 Day 2 of admission.  Subjective: denies any acute complaints. Breathing is better. Swelling has also improved. No chest pain. Nutrition: able to tolerate oral diet. Activity: bedridden at her baseline Last BM: prior to arrival  Assessment and Plan: 1. Acute on chronic combined systolic (congestive) and diastolic (congestive) heart failure [facility]) Patient presented with complaints of sudden onset of worsening shortness of breath. Patient was hypoxic requiring oxygen. Currently-6 L Echocardiogram last 45-50%with LVH. Continue Plavix, resume Coreg at a lower dose, discontinue nitroglycerin drip and placed the patient on Imdur. Continue Lasix but currently 80 mg, and 40 mg IV tomorrow. Patient appears hemodynamically stable at present with transfer to telemetry unit.  2.diabetes mellitus. The patient had an episode of hypoglycemia here in the hospital and therefore her Lantus was changed. Currently we will increase her Lantus to 8 units and also increased the sliding scale to moderate from sensitive.  3. Anoxic brain injury, nonverbal at her baseline. Continue close monitoring.  4.chronic kidney disease stage IV. We will continue close monitoring renal function daily.  5. Possible UTI. Currently on ceftriaxone and will continue.  6. Protein calorie malnutrition. On ensure but I would preferre to change to Glucerna due to diabetes.  DVT Prophylaxis: subcutaneous Heparin Nutrition: dysphagia type II diet Advance goals of care discussion: DNR/DNI  Brief Summary of Hospitalization:  HPI: As per the H&P on 11/27/2014 from Dr. Elvera Lennox, patient presented with complains of shortness of breath ongoing for one week. Patient presented with elevated BNP and mild elevation of serum troponin and hypoxia with saturation  in 70s at home on room air. Daily update: Started on IV Lasix twice a day 11/27/2014. Initially was on oxygen. Was also started on ceftriaxone for UTI. Creatinine remained stable. Patient diuresed 3 L negative on 11/28/2014. Consultants: None Procedures: None Antibiotics: Anti-infectives    Start     Dose/Rate Route Frequency Ordered Stop   11/27/14 1730  cefTRIAXone (ROCEPHIN) 1 g in dextrose 5 % 50 mL IVPB     1 g 100 mL/hr over 30 Minutes Intravenous Every 24 hours 11/27/14 1729       Family Communication: family was present at bedside, opportunity was given to ask question and all questions were answered satisfactorily at the time of interview.  Disposition:  Expected discharge date: 12/01/2014 Barriers to safe discharge: hypoxia   Intake/Output Summary (Last 24 hours) at 11/29/14 0707 Last data filed at 11/29/14 0600  Gross per 24 hour  Intake    312 ml  Output   3000 ml  Net  -2688 ml   Filed Weights   11/27/14 1827 11/28/14 0500 11/29/14 0500  Weight: 68.9 kg (151 lb 14.4 oz) 69.9 kg (154 lb 1.6 oz) 66.1 kg (145 lb 11.6 oz)    Objective: Physical Exam: Filed Vitals:   11/29/14 0530 11/29/14 0600 11/29/14 0615 11/29/14 0630  BP: 164/78 172/73 161/67 162/75  Pulse: 86 85 85 87  Temp:      TempSrc:      Resp: Height:      Weight:      SpO2: 96% 95% 95% 94%     General: Appear in mild distress, no Rash; Oral Mucosa moist. Cardiovascular: S1 and S2 Present, no Murmur, no JVD Respiratory: Bilateral Air entry present  and basal Crackles, no wheezes Abdomen: Bowel Sound present, Soft and no tenderness Extremities: trace Pedal edema, no calf tenderness Neurology: Grossly no focal neuro deficit.  Data Reviewed: CBC:  Recent Labs Lab 11/27/14 1431 11/29/14 0338  WBC 9.1 9.7  HGB 9.3* 9.4*  HCT 29.9* 29.1*  MCV 97.1 96.4  PLT 291 274   Basic Metabolic Panel:  Recent Labs Lab 11/27/14 1431 11/28/14 0455 11/29/14 0338  NA 141 144 138  K  5.2* 4.2 4.7  CL 113* 115* 107  CO2 22 21* 23  GLUCOSE 101* 63* 288*  BUN 30* 30* 33*  CREATININE 2.98* 2.97* 3.01*  CALCIUM 8.7* 8.6* 8.5*   Liver Function Tests:  Recent Labs Lab 11/27/14 1514  AST 28  ALT 18  ALKPHOS 62  BILITOT 0.5  PROT 5.9*  ALBUMIN 2.8*   Cardiac Enzymes:  Recent Labs Lab 11/27/14 1514 11/27/14 1915 11/28/14 0455  TROPONINI 0.08* 0.06* 0.07*   BNP (last 3 results)  Recent Labs  10/04/14 0630 11/27/14 1431  BNP 1501.8* 2074.9*    CBG:  Recent Labs Lab 11/28/14 0653 11/28/14 1227 11/28/14 1808 11/28/14 2109 11/28/14 2358  GLUCAP 87 191* 292* 257* 274*    Recent Results (from the past 240 hour(s))  MRSA PCR Screening     Status: None   Collection Time: 11/27/14  7:26 PM  Result Value Ref Range Status   MRSA by PCR NEGATIVE NEGATIVE Final    Comment:        The GeneXpert MRSA Assay (FDA approved for NASAL specimens only), is one component of a comprehensive MRSA colonization surveillance program. It is not intended to diagnose MRSA infection nor to guide or monitor treatment for MRSA infections.      Studies: No results found.   Scheduled Meds: . antiseptic oral rinse  7 mL Mouth Rinse BID  . atorvastatin  20 mg Oral Daily  . carvedilol  6.25 mg Oral BID WC  . cefTRIAXone (ROCEPHIN)  IV  1 g Intravenous Q24H  . clopidogrel  75 mg Oral Daily  . feeding supplement (ENSURE ENLIVE)  237 mL Oral BID BM  . furosemide  80 mg Intravenous Q12H  . heparin  5,000 Units Subcutaneous 3 times per day  . insulin aspart  0-9 Units Subcutaneous TID WC  . insulin glargine  4 Units Subcutaneous QHS  . isosorbide mononitrate  30 mg Oral Daily  . sodium chloride  3 mL Intravenous Q12H   Continuous Infusions:    Time spent: 35 minutes  Author: Lynden OxfordPranav Shaaron Golliday, MD [facility] Pager: [phone] 11/29/2014 7:07 AM  If 7PM-7AM, please contact night-coverage at [email or web address], password [email or web address]

## 2014-11-29 NOTE — Progress Notes (Signed)
BP continues to be above nitro gtt parameters, titrated up to 12 ml/hr.

## 2014-11-29 NOTE — H&P (Deleted)
Called report to 3E.  All questions answered.

## 2014-11-29 NOTE — Progress Notes (Signed)
BP has been elevated throughout shift, nitro gtt titrated to adjust for high BP, now at 9 ml/hr. Will continue to assess.

## 2014-11-30 DIAGNOSIS — N184 Chronic kidney disease, stage 4 (severe): Secondary | ICD-10-CM

## 2014-11-30 DIAGNOSIS — L8990 Pressure ulcer of unspecified site, unspecified stage: Secondary | ICD-10-CM

## 2014-11-30 LAB — BASIC METABOLIC PANEL
ANION GAP: 10 (ref 5–15)
BUN: 40 mg/dL — AB (ref 6–20)
CALCIUM: 8.2 mg/dL — AB (ref 8.9–10.3)
CO2: 23 mmol/L (ref 22–32)
CREATININE: 3.03 mg/dL — AB (ref 0.44–1.00)
Chloride: 104 mmol/L (ref 101–111)
GFR calc Af Amer: 16 mL/min — ABNORMAL LOW (ref >60)
GFR, EST NON AFRICAN AMERICAN: 14 mL/min — AB (ref >60)
GLUCOSE: 276 mg/dL — AB (ref 65–99)
Potassium: 4.3 mmol/L (ref 3.5–5.1)
Sodium: 137 mmol/L (ref 135–145)

## 2014-11-30 LAB — CBC
HCT: 27.2 % — ABNORMAL LOW (ref 36.0–46.0)
Hemoglobin: 8.6 g/dL — ABNORMAL LOW (ref 12.0–15.0)
MCH: 30.6 pg (ref 26.0–34.0)
MCHC: 31.6 g/dL (ref 30.0–36.0)
MCV: 96.8 fL (ref 78.0–100.0)
PLATELETS: 223 10*3/uL (ref 150–400)
RBC: 2.81 MIL/uL — ABNORMAL LOW (ref 3.87–5.11)
RDW: 13.6 % (ref 11.5–15.5)
WBC: 7.9 10*3/uL (ref 4.0–10.5)

## 2014-11-30 LAB — PROTIME-INR
INR: 1.26 (ref 0.00–1.49)
PROTHROMBIN TIME: 15.9 s — AB (ref 11.6–15.2)

## 2014-11-30 LAB — GLUCOSE, CAPILLARY
GLUCOSE-CAPILLARY: 37 mg/dL — AB (ref 65–99)
GLUCOSE-CAPILLARY: 217 mg/dL — AB (ref 65–99)
GLUCOSE-CAPILLARY: 163 mg/dL — AB (ref 65–99)
GLUCOSE-CAPILLARY: 270 mg/dL — AB (ref 65–99)
Glucose-Capillary: 243 mg/dL — ABNORMAL HIGH (ref 65–99)

## 2014-11-30 LAB — MAGNESIUM: MAGNESIUM: 1.5 mg/dL — AB (ref 1.7–2.4)

## 2014-11-30 MED ORDER — INSULIN GLARGINE 100 UNIT/ML ~~LOC~~ SOLN
14.0000 [IU] | Freq: Every day | SUBCUTANEOUS | Status: DC
  Administered 2014-11-30: 14 [IU] via SUBCUTANEOUS
  Filled 2014-11-30 (×2): qty 0.14

## 2014-11-30 MED ORDER — GLUCERNA SHAKE PO LIQD
237.0000 mL | Freq: Two times a day (BID) | ORAL | Status: DC
  Administered 2014-12-01: 237 mL via ORAL

## 2014-11-30 MED ORDER — GLUCERNA PO LIQD: 237.0000 mL | Freq: Two times a day (BID) | ORAL | Status: DC

## 2014-11-30 MED ORDER — MAGNESIUM SULFATE 2 GM/50ML IV SOLN
2.0000 g | Freq: Once | INTRAVENOUS | Status: AC
  Administered 2014-11-30: 2 g via INTRAVENOUS
  Filled 2014-11-30: qty 50

## 2014-11-30 MED ORDER — FUROSEMIDE 10 MG/ML IJ SOLN
40.0000 mg | Freq: Every day | INTRAMUSCULAR | Status: DC
  Administered 2014-11-30 – 2014-12-01 (×2): 40 mg via INTRAVENOUS
  Filled 2014-11-30 (×2): qty 4

## 2014-11-30 NOTE — Evaluation (Signed)
Physical Therapy Evaluation Patient Details Name: Rachel Curry MRN: [id number] DOB: [birth year] Today's Date: 11/30/2014   History of Present Illness  74 yo female with anoxic brain injury from age 74 was admitted with hypoxia, CHF, elevated troponin and UTI.  Husband is caregiver.  PMHx: MI, EF 45-50%, CKD 4, DM  Clinical Impression  Pt was unable to verbalize specifics about how she is doing but did demonstrate some tolerance for transfers and mobility out of bed.  Her plan is hopefully to get to SNF as she has been declining for some time.    Follow Up Recommendations SNF (Husband will likely refuse)    Equipment Recommendations  None recommended by PT (await SNF disposition)    Recommendations for Other Services       Precautions / Restrictions Precautions Precautions: Fall (telemetry) Restrictions Weight Bearing Restrictions: No      Mobility  Bed Mobility Overal bed mobility: Needs Assistance Bed Mobility: Supine to Sit     Supine to sit: Max assist;HOB elevated (did with one person but recommend 2)        Transfers Overall transfer level: Needs assistance Equipment used: 1 person hand held assist Transfers: Sit to/from [employer] CorporationStand;Stand Pivot Transfers Sit to Stand: Max assist Stand pivot transfers: From elevated surface;Max assist       General transfer comment: Pt is initially resistant to moving but then let PT help her stand  Ambulation/Gait             General Gait Details: unable other than short shifting of feet on floor  Stairs            Wheelchair Mobility    Modified Rankin (Stroke Patients Only)       Balance Overall balance assessment: Needs assistance Sitting-balance support: Feet supported;Bilateral upper extremity supported (help with trunk support) Sitting balance-Leahy Scale: Poor Sitting balance - Comments: shifting backward at times Postural control: Posterior lean Standing balance support: Bilateral upper extremity  supported Standing balance-Leahy Scale: Poor                               Pertinent Vitals/Pain Pain Assessment: Faces Pain Score: 4  Faces Pain Scale: Hurts little more Pain Location: general with movement intially to stand Pain Intervention(s): Monitored during session;Repositioned    Home Living Family/patient expects to be discharged to:: Private residence Living Arrangements: Spouse/significant other Available Help at Discharge: Family;Available 24 hours/day Type of Home: House         Home Equipment: Walker - 2 wheels;Wheelchair - manual Additional Comments: bed to chair transfers mainly as she has stopped being able to walk    Prior Function Level of Independence: Needs assistance   Gait / Transfers Assistance Needed: assisted to transfer to chair by husband  ADL's / Homemaking Assistance Needed: husband does housework        Hand Dominance        Extremity/Trunk Assessment   Upper Extremity Assessment: Generalized weakness;Difficult to assess due to impaired cognition           Lower Extremity Assessment: Generalized weakness;Difficult to assess due to impaired cognition      Cervical / Trunk Assessment: Kyphotic  Communication   Communication: Other (comment);Expressive difficulties (shakes head no to most questions)  Cognition Arousal/Alertness: Awake/alert Behavior During Therapy: Flat affect Overall Cognitive Status: History of cognitive impairments - at baseline Area of Impairment: Following commands;Safety/judgement;Awareness;Problem solving;Memory;Attention;Orientation Orientation Level: Place;Time;Situation Current Attention Level: Selective  Memory: Decreased recall of precautions;Decreased short-term memory Following Commands: Follows one step commands inconsistently Safety/Judgement: Decreased awareness of safety;Decreased awareness of deficits Awareness: Intellectual Problem Solving: Slow processing;Decreased  initiation;Difficulty sequencing;Requires verbal cues;Requires tactile cues General Comments: continual assist to manage her transfer to stand    General Comments General comments (skin integrity, edema, etc.): Pt is up in chair with pillows propping, has legs elevated and talked with daughter about rehab options.  Pt is clearly planned to go  home per daughter due to his unwillingness to let pt go to SNF    Exercises        Assessment/Plan    PT Assessment Patient needs continued PT services  PT Diagnosis Generalized weakness   PT Problem List Decreased strength;Decreased range of motion;Decreased activity tolerance;Decreased balance;Decreased mobility;Decreased coordination;Decreased cognition;Decreased knowledge of use of DME;Decreased safety awareness;Decreased knowledge of precautions;Cardiopulmonary status limiting activity;Obesity  PT Treatment Interventions DME instruction;Gait training;Functional mobility training;Therapeutic activities;Therapeutic exercise;Balance training;Neuromuscular re-education;Patient/family education   PT Goals (Current goals can be found in the Care Plan section) Acute Rehab PT Goals Patient Stated Goal: not able to state PT Goal Formulation: With family Time For Goal Achievement: 12/14/14 Potential to Achieve Goals: Good    Frequency Min 2X/week   Barriers to discharge Decreased caregiver support;Other (comment) (husband will need a second person to assist him for transfer)      Co-evaluation               End of Session Equipment Utilized During Treatment: Oxygen Activity Tolerance: Patient tolerated treatment well;Patient limited by fatigue Patient left: in chair;with call bell/phone within reach;with family/visitor present;Other (comment) (instruction to nursing about 2 person transfer back to bed) Nurse Communication: Mobility status         Time: 1020-1044 PT Time Calculation (min) (ACUTE ONLY): 24 min   Charges:   PT  Evaluation $Initial PT Evaluation Tier I: 1 Procedure PT Treatments $Therapeutic Activity: 8-22 mins   PT G Codes:        Ivar Drape 2014-12-14, 12:49 PM   Samul Dada, PT MS Acute Rehab Dept. Number: [id number] and [facility] [phone]

## 2014-11-30 NOTE — Care Management Important Message (Signed)
Important Message  Patient Details  Name: Rachel Curry MRN: [id number] Date of Birth: 06/24/1940   Medicare Important Message Given:  Yes    Tamyah Cutbirth P Veeda Virgo 11/30/2014, 1:11 PM

## 2014-11-30 NOTE — Progress Notes (Signed)
[facility] Progress Note    Patient: Rachel Curry     ZOX:[id number]  DOB: 1940/02/08     DOA: 11/27/2014 Date of Service: the patient was seen and examined on 11/30/2014 Day 3 of admission.  Subjective: Nonverbal patient denies having any acute complaint. Family reports breathing is better as well as appetite. Nutrition: able to tolerate oral diet. Activity: bedridden at her baseline Last BM: 11/29/2014  Assessment and Plan: 1. Acute on chronic combined systolic (congestive) and diastolic (congestive) heart failure [facility](HCC) Patient presented with complaints of sudden onset of worsening shortness of breath. Patient was hypoxic requiring oxygen. Currently-7 L Echocardiogram last 45-50%with LVH. Continue Plavix, Coreg at a lower dose, and Imdur. Continue Lasix 40 mg IV tomorrow. Renal function stable   2.diabetes mellitus. The patient had an episode of hypoglycemia here in the hospital and therefore her Lantus was changed. Increase Lantus to 14 units, continue sliding scale  3. Anoxic brain injury, nonverbal at her baseline. Continue close monitoring.  4.chronic kidney disease stage IV. We will continue close monitoring renal function daily.  5. Possible UTI. completed therapy 3 days  6 Protein calorie malnutrition. On ensure but I would preferre to change to Glucerna due to diabetes.  DVT Prophylaxis: subcutaneous Heparin Nutrition: dysphagia type 3 diet  Advance goals of care discussion: DNR/DNI  Brief Summary of Hospitalization:  HPI: As per the H&P on 11/27/2014 from Dr. Elvera LennoxGherghe, patient presented with complains of shortness of breath ongoing for one week. Patient presented with elevated BNP and mild elevation of serum troponin and hypoxia with saturation in 70s at home on room air. Daily update: Started on IV Lasix twice a day 11/27/2014. Initially was on oxygen. Was also started on ceftriaxone for UTI. Creatinine remained stable. Patient diuresed 3 L negative on  11/28/2014. Consultants: None Procedures: None Antibiotics: Anti-infectives    Start     Dose/Rate Route Frequency Ordered Stop   11/27/14 1730  cefTRIAXone (ROCEPHIN) 1 g in dextrose 5 % 50 mL IVPB  Status:  Discontinued     1 g 100 mL/hr over 30 Minutes Intravenous Every 24 hours 11/27/14 1729 11/30/14 0835     Family Communication: family was present at bedside, opportunity was given to ask question and all questions were answered satisfactorily at the time of interview.  Disposition:  Expected discharge date: 12/01/2014 Barriers to safe discharge: Renal function    Intake/Output Summary (Last 24 hours) at 11/30/14 1537 Last data filed at 11/30/14 1306  Gross per 24 hour  Intake   1100 ml  Output   2050 ml  Net   -950 ml   Filed Weights   11/29/14 0500 11/29/14 1851 11/30/14 0356  Weight: 66.1 kg (145 lb 11.6 oz) 67 kg (147 lb 11.3 oz) 64.91 kg (143 lb 1.6 oz)    Objective: Physical Exam: Filed Vitals:   11/30/14 0338 11/30/14 0356 11/30/14 0804 11/30/14 1443  BP: 146/76  145/57 113/78  Pulse: 84  73 75  Temp: 98.4 F (36.9 C)  97.8 F (36.6 C) 98.4 F (36.9 C)  TempSrc: Oral  Oral Oral  Resp: 20  18 18   Height:      Weight:  64.91 kg (143 lb 1.6 oz)    SpO2: 98%  98% 96%    General: Appear in mild distress, no Rash; Oral Mucosa moist. Cardiovascular: S1 and S2 Present, no Murmur, Respiratory: Bilateral Air entry present and basal Crackles, no wheezes Abdomen: Bowel Sound present, Soft and no tenderness Extremities: trace  Pedal edema, no calf tenderness  Data Reviewed: CBC:  Recent Labs Lab 11/27/14 1431 11/29/14 0338 11/30/14 0405  WBC 9.1 9.7 7.9  HGB 9.3* 9.4* 8.6*  HCT 29.9* 29.1* 27.2*  MCV 97.1 96.4 96.8  PLT 291 274 223   Basic Metabolic Panel:  Recent Labs Lab 11/27/14 1431 11/28/14 0455 11/29/14 0338 11/30/14 0405  NA 141 144 138 137  K 5.2* 4.2 4.7 4.3  CL 113* 115* 107 104  CO2 22 21* 23 23  GLUCOSE 101* 63* 288* 276*  BUN  30* 30* 33* 40*  CREATININE 2.98* 2.97* 3.01* 3.03*  CALCIUM 8.7* 8.6* 8.5* 8.2*  MG  --   --   --  1.5*   Liver Function Tests:  Recent Labs Lab 11/27/14 1514  AST 28  ALT 18  ALKPHOS 62  BILITOT 0.5  PROT 5.9*  ALBUMIN 2.8*   Cardiac Enzymes:  Recent Labs Lab 11/27/14 1514 11/27/14 1915 11/28/14 0455  TROPONINI 0.08* 0.06* 0.07*   BNP (last 3 results)  Recent Labs  10/04/14 0630 11/27/14 1431  BNP 1501.8* 2074.9*    CBG:  Recent Labs Lab 11/29/14 1313 11/29/14 1704 11/29/14 2054 11/30/14 0608 11/30/14 1108  GLUCAP 235* 271* 257* 217* 163*    Recent Results (from the past 240 hour(s))  MRSA PCR Screening     Status: None   Collection Time: 11/27/14  7:26 PM  Result Value Ref Range Status   MRSA by PCR NEGATIVE NEGATIVE Final    Comment:        The GeneXpert MRSA Assay (FDA approved for NASAL specimens only), is one component of a comprehensive MRSA colonization surveillance program. It is not intended to diagnose MRSA infection nor to guide or monitor treatment for MRSA infections.      Studies: No results found.   Scheduled Meds: . antiseptic oral rinse  7 mL Mouth Rinse BID  . atorvastatin  20 mg Oral Daily  . carvedilol  6.25 mg Oral BID WC  . clopidogrel  75 mg Oral Daily  . feeding supplement (ENSURE ENLIVE)  237 mL Oral BID BM  . furosemide  40 mg Intravenous Daily  . heparin  5,000 Units Subcutaneous 3 times per day  . insulin aspart  0-15 Units Subcutaneous TID WC  . insulin aspart  0-5 Units Subcutaneous QHS  . insulin glargine  14 Units Subcutaneous QHS  . isosorbide mononitrate  30 mg Oral Daily  . sodium chloride  3 mL Intravenous Q12H   Continuous Infusions:    Time spent: 30 minutes  Author: Lynden Oxford, MD [facility] Pager: [phone] 11/30/2014 3:37 PM  If 7PM-7AM, please contact night-coverage at [email or web address], password [facility]

## 2014-11-30 NOTE — Progress Notes (Signed)
Utilization review completed. Neizan Debruhl, RN, BSN. 

## 2014-12-01 DIAGNOSIS — I10 Essential (primary) hypertension: Secondary | ICD-10-CM

## 2014-12-01 LAB — CBC
HCT: 26.9 % — ABNORMAL LOW (ref 36.0–46.0)
HEMOGLOBIN: 8.4 g/dL — AB (ref 12.0–15.0)
MCH: 29.7 pg (ref 26.0–34.0)
MCHC: 31.2 g/dL (ref 30.0–36.0)
MCV: 95.1 fL (ref 78.0–100.0)
PLATELETS: 264 10*3/uL (ref 150–400)
RBC: 2.83 MIL/uL — AB (ref 3.87–5.11)
RDW: 13.6 % (ref 11.5–15.5)
WBC: 9 10*3/uL (ref 4.0–10.5)

## 2014-12-01 LAB — BASIC METABOLIC PANEL
ANION GAP: 9 (ref 5–15)
BUN: 47 mg/dL — ABNORMAL HIGH (ref 6–20)
CALCIUM: 8.1 mg/dL — AB (ref 8.9–10.3)
CO2: 26 mmol/L (ref 22–32)
CREATININE: 3.01 mg/dL — AB (ref 0.44–1.00)
Chloride: 103 mmol/L (ref 101–111)
GFR, EST AFRICAN AMERICAN: 17 mL/min — AB (ref >60)
GFR, EST NON AFRICAN AMERICAN: 14 mL/min — AB (ref >60)
Glucose, Bld: 107 mg/dL — ABNORMAL HIGH (ref 65–99)
Potassium: 4.2 mmol/L (ref 3.5–5.1)
SODIUM: 138 mmol/L (ref 135–145)

## 2014-12-01 LAB — GLUCOSE, CAPILLARY
GLUCOSE-CAPILLARY: 91 mg/dL (ref 65–99)
Glucose-Capillary: 159 mg/dL — ABNORMAL HIGH (ref 65–99)

## 2014-12-01 LAB — BRAIN NATRIURETIC PEPTIDE: B Natriuretic Peptide: 881.5 pg/mL — ABNORMAL HIGH (ref 0.0–100.0)

## 2014-12-01 LAB — MAGNESIUM: MAGNESIUM: 2.1 mg/dL (ref 1.7–2.4)

## 2014-12-01 MED ORDER — ISOSORBIDE MONONITRATE ER 30 MG PO TB24: 30.0000 mg | ORAL_TABLET | Freq: Every day | ORAL | Status: DC

## 2014-12-01 MED ORDER — FUROSEMIDE 40 MG PO TABS: 40.0000 mg | ORAL_TABLET | Freq: Every day | ORAL | Status: DC

## 2014-12-01 MED ORDER — CARVEDILOL 6.25 MG PO TABS
6.2500 mg | ORAL_TABLET | Freq: Two times a day (BID) | ORAL | Status: DC
  Administered 2014-12-01: 6.25 mg via ORAL
  Filled 2014-12-01: qty 1

## 2014-12-01 MED ORDER — INSULIN ASPART 100 UNIT/ML ~~LOC~~ SOLN: SUBCUTANEOUS | Status: DC

## 2014-12-01 MED ORDER — GLUCERNA SHAKE PO LIQD: 237.0000 mL | Freq: Two times a day (BID) | ORAL | Status: AC

## 2014-12-01 MED ORDER — ONDANSETRON 4 MG PO TBDP: 4.0000 mg | ORAL_TABLET | Freq: Three times a day (TID) | ORAL | Status: DC | PRN

## 2014-12-01 MED ORDER — ONDANSETRON 4 MG PO TBDP: 4.0000 mg | ORAL_TABLET | Freq: Three times a day (TID) | ORAL | Status: AC | PRN

## 2014-12-01 MED ORDER — GLUCERNA SHAKE PO LIQD: 237.0000 mL | Freq: Two times a day (BID) | ORAL | Status: DC

## 2014-12-01 MED ORDER — CARVEDILOL 6.25 MG PO TABS
6.2500 mg | ORAL_TABLET | Freq: Two times a day (BID) | ORAL | Status: DC
Start: 2014-12-01 — End: 2015-01-07

## 2014-12-01 MED ORDER — INSULIN ASPART 100 UNIT/ML ~~LOC~~ SOLN: SUBCUTANEOUS | Status: AC

## 2014-12-01 MED ORDER — CARVEDILOL 6.25 MG PO TABS: 6.2500 mg | ORAL_TABLET | Freq: Two times a day (BID) | ORAL | Status: DC

## 2014-12-01 NOTE — Care Management Note (Signed)
Case Management Note  Patient Details  Name: Rachel Curry MRN: [id number] Date of Birth: May 08, 1940  Subjective/Objective:       Admitted with CHF             Action/Plan: Patient lives with spouse/ primary caregiver [facility], spouse requested [facility] for HHRN/ PT. Lupita Leashonna with [facility] called for arrangements. No DME id needed at this time. For ambulance transportation home  Expected Discharge Date:  12/02/14               Expected Discharge Plan:  Home w Home Health Services Discharge planning Services  CM Consult  Post Acute Care Choice:    Choice offered to:  Spouse  HH Arranged[facility]  [facility]  [facility]  Status of Service:  In process, will continue to follow  Medicare Important Message Given:  Yes  Reola MosherChandler, Lore Polka L, RN,MHA,BSN [phone] 12/01/2014, 10:57 AM

## 2014-12-01 NOTE — Discharge Summary (Signed)
[facility] Discharge Summary   Patient: Rachel Curry    ZOX:[id number] PCP: Gaspar Garbeichard W Tisovec, MD    DOB: [birth year] Date of admission: 11/27/2014  Date of discharge: 12/01/2014   Discharge Diagnoses:  Principal Problem:   Acute on chronic combined systolic (congestive) and diastolic (congestive) heart failure (HCC) Active Problems:   Acute respiratory failure with hypoxia (HCC)   DM (diabetes mellitus), type 2 with renal complications (HCC)   Anoxic brain damage (HCC)   Anemia in chronic kidney disease   HLD (hyperlipidemia)   CAD (coronary artery disease)   HTN (hypertension)   UTI (urinary tract infection)   Chronic kidney disease (CKD), stage IV (severe) (HCC)   Pressure ulcer   Recommendations for Outpatient Follow-up:  1. Follow up with PCP in 1 week, get BMP blood work checked. 2. Establish care with cardiology for CHF after discharge.  For Heart failure patients -  Check your Weight same time everyday If you gain over 2 pounds, or you develop in leg swelling, experience more shortness of breath or chest pain, call your Primary MD immediately.  Avoid adding extra salt in the food, food high in salt and Follow 1.5 lit/day fluid restriction.   Activity: as tolerated  Discharge Condition: good  History of present illness:  As per the H&P on 11/27/2014 from Dr. Elvera LennoxGherghe, patient presented with complains of shortness of breath ongoing for one week. Patient presented with elevated BNP and mild elevation of serum troponin and hypoxia with saturation in 70s at home on room air.  Hospital Course:  The patient presented with complaints of hypoxia as well as increasing shortness of breath. Reportedly the patient was recently hospitalized for CHF and was discharged on Coreg and Lasix but patient was never given refill of her prescription after the discharge and did not take these medication.  Summary of her active problems in the hospital is as following. 1. Acute on chronic  combined systolic (congestive) and diastolic (congestive) heart failure [facility](HCC) Patient presented with complaints of sudden onset of worsening shortness of breath.  Patient was hypoxic requiring oxygen, at the time of discharge she remained on room air for 24 hours Patient was -8 L at the time of discharge, BN reduced from 2074-881. Echocardiogram 10/05/2014 45-50% with LVH. Continue Plavix, Coreg at a lower dose, and Imdur. Continue Lasix 40 mg daily Renal function stable Will need a repeat BMP on discharge Patient will also need to establish care with the cardiology to continue titrating up cardioprotective medications  2.diabetes mellitus. The patient had an episode of hypoglycemia here in the hospital and therefore her Lantus was changed. Patient was recommended to continue 14 units of Lantus on discharge and use sliding scale for short-acting insulin. Her insulin 75-25 was discontinued on discharge due to adequate control with above regimen  3. Anoxic brain injury, nonverbal at her baseline. Continue outpatient therapy  4.chronic kidney disease stage IV. Renal function remained stable. The patient will need a repeat BMP on discharge  5. Possible UTI. completed therapy 3 days  6 Protein calorie malnutrition. On ensure but I would preferre to change to Glucerna due to diabetes.  All other chronic medical condition were stable during the hospitalization. Patient was seen by physical therapy, who recommended SNF, but the family preferred to take the patient at home with home health support, which was arranged by Child psychotherapistsocial worker and case Production designer, theatre/television/filmmanager. On the day of the discharge the patient's oxygenation remained stable, and no other acute medical condition were  reported by patient. the patient was felt safe to be discharge at home with home health and family support.  Procedures and Results:  none   Consultations:  none  Discharge Exam: Filed Weights   11/29/14 1851 11/30/14 0356  12/01/14 0500  Weight: 67 kg (147 lb 11.3 oz) 64.91 kg (143 lb 1.6 oz) 65.8 kg (145 lb 1 oz)   Filed Vitals:   12/01/14 0500 12/01/14 1024  BP: 112/73 130/67  Pulse: 74 75  Temp: 97.9 F (36.6 C) 98.2 F (36.8 C)  Resp: 18 18    General: alert awake and in no acute distress Cardiovascular: S1 and S2 present, aortic murmur Respiratory: clear to auscultation Abdomen: bowel sounds present  DISCHARGE MEDICATION: Discharge Instructions    (HEART FAILURE PATIENTS) Call MD:  Anytime you have any of the following symptoms: 1) 3 pound weight gain in 24 hours or 5 pounds in 1 week 2) shortness of breath, with or without a dry hacking cough 3) swelling in the hands, feet or stomach 4) if you have to sleep on extra pillows at night in order to breathe.    Complete by:  As directed      Ambulatory referral to Cardiology    Complete by:  As directed   CHF     Call MD for:  difficulty breathing, headache or visual disturbances    Complete by:  As directed      Call MD for:  persistant nausea and vomiting    Complete by:  As directed      Diet - low sodium heart healthy    Complete by:  As directed      Diet Carb Modified    Complete by:  As directed      Heart Failure patients record your daily weight using the same scale at the same time of day    Complete by:  As directed      Incentive spirometry RT    Complete by:  As directed      Increase activity slowly    Complete by:  As directed           Discharge Medication List as of 12/01/2014 11:54 AM    CONTINUE these medications which have CHANGED   Details  carvedilol (COREG) 6.25 MG tablet Take 1 tablet (6.25 mg total) by mouth 2 (two) times daily with a meal., Starting 12/01/2014, Until Discontinued, Print    feeding supplement, GLUCERNA SHAKE, (GLUCERNA SHAKE) LIQD Take 237 mLs by mouth 2 (two) times daily between meals., Starting 12/01/2014, Until Discontinued, Print    furosemide (LASIX) 40 MG tablet Take 1 tablet (40 mg  total) by mouth daily., Starting 12/01/2014, Until Discontinued, Print    insulin aspart (NOVOLOG) 100 UNIT/ML injection 0-15 Units, Subcutaneous, 3 times daily with meals, CBG 70 - 120: 0 units CBG 121 - 150: 2 units CBG 151 - 200: 3 units CBG 201 - 250: 5 units CBG 251 - 300: 8 units CBG 301 - 350: 11 units CBG 351 - 400: 15 units CBG > 400 for 2 reading: call MD , Print    isosorbide mononitrate (IMDUR) 30 MG 24 hr tablet Take 1 tablet (30 mg total) by mouth daily., Starting 12/01/2014, Until Discontinued, Print    ondansetron (ZOFRAN ODT) 4 MG disintegrating tablet Take 1 tablet (4 mg total) by mouth every 8 (eight) hours as needed for nausea or vomiting., Starting 12/01/2014, Until Discontinued, Print      CONTINUE these medications  which have NOT CHANGED   Details  atorvastatin (LIPITOR) 20 MG tablet Take 20 mg by mouth daily., Starting 07/15/2014, Until Discontinued, Historical Med    clopidogrel (PLAVIX) 75 MG tablet Take 75 mg by mouth daily., Starting 07/15/2014, Until Discontinued, Historical Med    fenofibrate (TRICOR) 145 MG tablet Take 145 mg by mouth daily., Starting 07/15/2014, Until Discontinued, Historical Med    insulin glargine (LANTUS) 100 UNIT/ML injection Inject 14 Units into the skin at bedtime., Until Discontinued, Historical Med    LORazepam (ATIVAN) 1 MG tablet Take 1 mg by mouth 2 (two) times daily as needed. anxiety, Starting 08/07/2014, Until Discontinued, Historical Med    omeprazole (PRILOSEC) 20 MG capsule Take 20 mg by mouth daily., Until Discontinued, Historical Med      STOP taking these medications     cephALEXin (KEFLEX) 500 MG capsule      insulin lispro protamine-lispro (HUMALOG 75/25 MIX) (75-25) 100 UNIT/ML SUSP injection        Allergies  Allergen Reactions  . Macrodantin [Nitrofurantoin Macrocrystal] Shortness Of Breath  . Azithromycin Diarrhea   Follow-up Information    Follow up with [facility].   Why:  They  will do your home health care at your home   Contact information:   [address] [place] [address] [phone]       Follow up with Gaspar Garbe, MD.   Specialty:  Internal Medicine   Why:  Left meesage for Dr. to call patient to schedule an appt   Contact information:   [address] [place] [address] [phone]       The results of significant diagnostics from this hospitalization (including imaging, microbiology, ancillary and laboratory) are listed below for reference.    Significant Diagnostic Studies: Dg Op Swallowing Func-medicare/speech Path  11/02/2014  CLINICAL DATA:  Dysphagia.  History of aspiration. EXAM: MODIFIED BARIUM SWALLOW TECHNIQUE: Different consistencies of barium were administered orally to the patient by the Speech Pathologist. Imaging of the pharynx was performed in the lateral projection. FLUOROSCOPY TIME:  Radiation Exposure Index (as provided by the fluoroscopic device): If the device does not provide the exposure index: Fluoroscopy Time:  1 minutes 21 seconds Number of Acquired Images: COMPARISON:  None. FINDINGS: Thin liquid- silent aspiration was noted. With chin-tuck maneuver, no significant penetration or aspiration was noted. Nectar thick liquid- premature spill.  No penetration or aspiration. Honey- not administered Pure- within normal limits Cracker-not administered Pure with cracker- within normal limits Barium tablet -  not administered IMPRESSION: One or 2 episodes of silent aspiration with thin liquids. This resolved with chin-tuck maneuver. No penetration with thick liquids or puree. Please refer to the Speech Pathologists report for complete details and recommendations. Electronically Signed   By: Marlan Palau M.D.   On: 11/02/2014 13:32 Objective Swallowing Evaluation:   Patient Details Name: Luva Metzger MRN: [id number] Date of Birth: Jul 28, 1940 Today's Date: 11/02/2014 Time: SLP Start Time (ACUTE ONLY): 1119-SLP Stop Time  (ACUTE ONLY): 1141 SLP Time Calculation (min) (ACUTE ONLY): 22 min Past Medical History: Past Medical History Diagnosis Date . Diabetes mellitus without complication (HCC)  . Hypercholesteremia  . MI, acute, non ST segment elevation [facility])  Past Surgical History: No past surgical history on file. HPI: Other Pertinent Information: 74 yo female with PMH significant for MI, anoxic brain injury, and dysphagia presents for OP MBS to assess for readiness to advance diet. Pt had recent MBS 10/05/14 recommending Dys 3 diet and nectar  thick liquids. No Data Recorded Assessment / Plan / Recommendation CHL IP CLINICAL IMPRESSIONS 11/02/2014 Therapy Diagnosis Mild pharyngeal phase dysphagia Clinical Impression Pt has a mild pharyngeal dysphagia with silent aspiration of thin liquids due to impaired timing. She does not follow commands to cough to clear aspirates and penetrates. Small sips increase airway protection, however given pt's cognitive status it is difficult to consistently regulate sip size despite Max assist from SLP. A chin tuck is also effective at preventing penetration/aspiration during this study, although pt needs Max A from SLP to position straw in a manner that facilitates this posture. Discussed with husband - pt would be able to continue soft solid diet with advancement to thin liquids if she is able to utilize a chin tuck consistently with liquids. Education and demonstrations provided for use of straw to facilitate posture. Should pt have difficulty using a chin tuck, then would continue to use nectar thick liquids. Pt will need continued [facility] SLP f/u for reinforcement of the above.   CHL IP TREATMENT RECOMMENDATION 11/02/2014 Treatment Recommendations F/u [facility] SLP   CHL IP DIET RECOMMENDATION 11/02/2014 SLP Diet Recommendations Dysphagia 3 (Mech soft);Thin Liquid Administration via (None) Medication Administration Whole meds with puree Compensations Minimize environmental distractions;Slow rate;Small  sips/bites;Use straw to facilitate chin tuck;Chin tuck Postural Changes and/or Swallow Maneuvers (None)   CHL IP OTHER RECOMMENDATIONS 11/02/2014 Recommended Consults (None) Oral Care Recommendations Oral care BID Other Recommendations (None)   CHL IP FOLLOW UP RECOMMENDATIONS 10/08/2014 Follow up Recommendations Home health SLP   CHL IP FREQUENCY AND DURATION 10/05/2014 Speech Therapy Frequency (ACUTE ONLY) min 2x/week Treatment Duration 2 weeks   Pertinent Vitals/Pain: n/a  SLP Swallow Goals No flowsheet data found. No flowsheet data found.   CHL IP REASON FOR REFERRAL 11/02/2014 Reason for Referral Objectively evaluate swallowing function   CHL IP ORAL PHASE 11/02/2014 Oral Phase WFL   CHL IP PHARYNGEAL PHASE 11/02/2014 Pharyngeal Phase Impaired   CHL IP CERVICAL ESOPHAGEAL PHASE 11/02/2014 Cervical Esophageal Phase WFL CHL IP GO 11/02/2014 Functional Assessment Tool Used skilled clinical judgment Functional Limitations Swallowing Swallow Current Status (Z6109) CJ Swallow Goal Status (U0454) CJ Swallow Discharge Status [phone]) CJ   Maxcine Ham, M.A. CCC-SLP [phone] Maxcine Ham 11/02/2014, 1:39 PM   Dg Chest Portable 1 View  11/27/2014  CLINICAL DATA:  Shortness of breath. EXAM: PORTABLE CHEST 1 VIEW COMPARISON:  10/04/2014 FINDINGS: Again noted is a left pleural effusion which is at least moderate in size. Left effusion appears to be slightly larger than the previous chest radiograph. Evidence for a small right pleural effusion. Prominent interstitial lung markings are suggestive for pulmonary edema. Heart size is slightly prominent and unchanged from the previous examination. Atherosclerotic calcifications at the aortic arch. IMPRESSION: Pulmonary edema with bilateral pleural effusions, left side greater than right. Electronically Signed   By: Richarda Overlie M.D.   On: 11/27/2014 15:08    Microbiology: Recent Results (from the past 240 hour(s))  MRSA PCR Screening     Status: None    Collection Time: 11/27/14  7:26 PM  Result Value Ref Range Status   MRSA by PCR NEGATIVE NEGATIVE Final    Comment:        The GeneXpert MRSA Assay (FDA approved for NASAL specimens only), is one component of a comprehensive MRSA colonization surveillance program. It is not intended to diagnose MRSA infection nor to guide or monitor treatment for MRSA infections.      Labs: CBC:  Recent Labs Lab 11/27/14 1431 11/29/14  1324 11/30/14 0405 12/01/14 0316  WBC 9.1 9.7 7.9 9.0  HGB 9.3* 9.4* 8.6* 8.4*  HCT 29.9* 29.1* 27.2* 26.9*  MCV 97.1 96.4 96.8 95.1  PLT 291 274 223 264   Basic Metabolic Panel:  Recent Labs Lab 11/27/14 1431 11/28/14 0455 11/29/14 0338 11/30/14 0405 12/01/14 0316  NA 141 144 138 137 138  K 5.2* 4.2 4.7 4.3 4.2  CL 113* 115* 107 104 103  CO2 22 21* 23 23 26   GLUCOSE 101* 63* 288* 276* 107*  BUN 30* 30* 33* 40* 47*  CREATININE 2.98* 2.97* 3.01* 3.03* 3.01*  CALCIUM 8.7* 8.6* 8.5* 8.2* 8.1*  MG  --   --   --  1.5* 2.1   Liver Function Tests:  Recent Labs Lab 11/27/14 1514  AST 28  ALT 18  ALKPHOS 62  BILITOT 0.5  PROT 5.9*  ALBUMIN 2.8*   No results for input(s): LIPASE, AMYLASE in the last 168 hours. No results for input(s): AMMONIA in the last 168 hours.  Cardiac Enzymes:  Recent Labs Lab 11/27/14 1514 11/27/14 1915 11/28/14 0455  TROPONINI 0.08* 0.06* 0.07*   BNP (last 3 results)  Recent Labs  10/04/14 0630 11/27/14 1431 12/01/14 0316  BNP 1501.8* 2074.9* 881.5*    ProBNP (last 3 results) No results for input(s): PROBNP in the last 8760 hours.  CBG:  Recent Labs Lab 11/30/14 1108 11/30/14 1617 11/30/14 2108 12/01/14 0606 12/01/14 1158  GLUCAP 163* 270* 243* 91 159*    Time spent: 35 minutes  Signed:  Zackrey Dyar  [facility] 12/01/2014, 11:11 PM

## 2014-12-01 NOTE — Progress Notes (Signed)
Patient was discharged to home accompanied by spouse and two children as the family wishes.  Prescriptions and discharge instructions , medication regimen and education given. Patient is non-verbal but the  patient spouse and two children verbalized understanding. Foley catheter, telemetry box and IV removed prior to discharged. Patient's stable. No signs and symptoms of distress noted. Denies any pain.

## 2014-12-01 NOTE — Progress Notes (Signed)
CSW met with patient, husband and family this morning prior to d/c to offer EMS transport home. Husband indicated that they have a van and transport chair and do not want EMS transport home. CSW notified nursing to complete d/c with patient/family as soon as possible as they are wanting to go home.  Out of facility DNR on chart for MD's signature.  Notified Dr. Posey Pronto who indicated that he would sign form.  No further CSW needs identified. CSW signing off. Lorie Phenix. Pauline Good, [place]

## 2014-12-01 NOTE — Discharge Instructions (Signed)
° °  It is important that you read following instructions as well as go over your medication list with RN to help you understand your care after this hospitalization.  Discharge Instructions: 1. Follow up with PCP in 1 week, get BMP blood work checked. 2. Establish care with cardiology for CHF after discharge.   For Heart failure patients -  Check your Weight same time everyday If you gain over 2 pounds, or you develop in leg swelling, experience more shortness of breath or chest pain, call your Primary MD immediately.  Avoid adding extra salt in the food, food high in salt and Follow 1.5 lit/day fluid restriction.   Please request your primary care physician to go over all Hospital Tests and Procedure/Radiological results at the follow up,  Please get all Hospital records sent to your PCP by signing hospital release before you go home.    You were cared for by a hospitalist during your hospital stay. If you have any questions about your discharge medications or the care you received while you were in the hospital after you are discharged, you can call the unit and ask to speak with the hospitalist on call if the hospitalist that took care of you is not available.   Once you are discharged, your primary care physician will handle any further medical issues.  Please note that NO REFILLS for any discharge medications will be authorized once you are discharged, as it is imperative that you return to your primary care physician (or establish a relationship with a primary care physician if you do not have one) for your aftercare needs so that they can reassess your need for medications and monitor your lab values.  You Must read complete instructions/literature along with all the possible adverse reactions/side effects for all the Medicines you take and that have been prescribed to you. Take any new Medicines after you have completely understood and accept all the possible adverse reactions/side  effects.  Wear Seat belts while driving.

## 2014-12-25 ENCOUNTER — Telehealth: Payer: Self-pay | Admitting: Cardiology

## 2014-12-25 NOTE — Telephone Encounter (Signed)
[facility] for appointment on 01/07/15 with Dr Jens Somrenshaw.  [facility] for Dr Ludwig Clarksrenshaw's schedule on 01/07/15. lp

## 2015-01-05 ENCOUNTER — Encounter: Payer: Self-pay | Admitting: Cardiology

## 2015-01-05 NOTE — Progress Notes (Signed)
HPI: 75 yo female for evaluation of CHF. Had MI years ago complicated by anoxic encephalopathy. Also with significant renal insufficiency. Admitted 10/16 and 11/16 with CHF; treated with diuretics. Echo 10/16 showed EF 45-50, moderate MR and moderate LAE. Last Cr 12/01/14-3.01. Patient at baseline does not communicate. She now does not ambulate and does not feed herself. Since resuming her Lasix she apparently has improved. Her husband states there is no dyspnea, chest pain. Occasional mild edema in the left lower extremity which is chronic.  Current Outpatient Prescriptions  Medication Sig Dispense Refill  . atorvastatin (LIPITOR) 20 MG tablet Take 20 mg by mouth daily.    . carvedilol (COREG) 6.25 MG tablet Take 1 tablet (6.25 mg total) by mouth 2 (two) times daily with a meal. 60 tablet 1  . clopidogrel (PLAVIX) 75 MG tablet Take 75 mg by mouth daily.    . feeding supplement, GLUCERNA SHAKE, (GLUCERNA SHAKE) LIQD Take 237 mLs by mouth 2 (two) times daily between meals. 30 Can 0  . fenofibrate (TRICOR) 145 MG tablet Take 145 mg by mouth daily.    . furosemide (LASIX) 40 MG tablet Take 1 tablet (40 mg total) by mouth daily. 30 tablet 1  . insulin aspart (NOVOLOG) 100 UNIT/ML injection 0-15 Units, Subcutaneous, 3 times daily with meals, CBG 70 - 120: 0 units CBG 121 - 150: 2 units CBG 151 - 200: 3 units CBG 201 - 250: 5 units CBG 251 - 300: 8 units CBG 301 - 350: 11 units CBG 351 - 400: 15 units CBG > 400 for 2 reading: call MD 10 mL 11  . insulin glargine (LANTUS) 100 UNIT/ML injection Inject 14 Units into the skin at bedtime.    . isosorbide mononitrate (IMDUR) 30 MG 24 hr tablet Take 1 tablet (30 mg total) by mouth daily. 30 tablet 1  . LORazepam (ATIVAN) 1 MG tablet Take 1 mg by mouth 2 (two) times daily as needed. anxiety    . omeprazole (PRILOSEC) 20 MG capsule Take 20 mg by mouth daily.    . ondansetron (ZOFRAN ODT) 4 MG disintegrating tablet Take 1 tablet (4 mg total) by mouth  every 8 (eight) hours as needed for nausea or vomiting. 20 tablet 0   No current facility-administered medications for this visit.    Allergies  Allergen Reactions  . Macrodantin [Nitrofurantoin Macrocrystal] Shortness Of Breath  . Azithromycin Diarrhea     Past Medical History  Diagnosis Date  . Diabetes mellitus without complication (HCC)   . Hypercholesteremia   . MI, acute, non ST segment elevation (HCC)   . CHF (congestive heart failure) (HCC)   . Anoxic encephalopathy (HCC)   . Renal insufficiency   . Hypertension   . CVA (cerebral infarction)     Past Surgical History  Procedure Laterality Date  . Abdominal hysterectomy      Social History   Social History  . Marital Status: Married    Spouse Name: N/A  . Number of Children: 4  . Years of Education: N/A   Occupational History  . Not on file.   Social History Main Topics  . Smoking status: Former Games developermoker  . Smokeless tobacco: Not on file  . Alcohol Use: No  . Drug Use: No  . Sexual Activity: Not on file   Other Topics Concern  . Not on file   Social History Narrative    Family History  Problem Relation Age of Onset  . Heart disease Mother  ROS: no fevers or chills, productive cough, hemoptysis, dysphasia, odynophagia, melena, hematochezia, dysuria, hematuria, rash, seizure activity, orthopnea, PND, claudication. Remaining systems are negative.  Physical Exam:   Blood pressure 124/62, pulse 72, height 5' (1.524 m), weight 145 lb (65.772 kg).  General:  Well developed/chronically ill appearing in NAD Skin warm/dry No peripheral clubbing Back-normal HEENT-normal/normal eyelids Neck supple/normal carotid upstroke bilaterally; no bruits; no JVD; no thyromegaly chest - CTA/ normal expansion CV - RRR/normal S1 and S2; no rubs or gallops;  PMI nondisplaced, 2/6 systolic murmur left sternal border. Abdomen -NT/ND, no HSM, no mass, + bowel sounds, no bruit 2+ femoral pulses, no bruits Ext-trace  edema, no chords Neuro-Patient does not communicate. Moves all extremities.  ECG 11/27/2014-sinus with anterior and inferior MI

## 2015-01-07 ENCOUNTER — Ambulatory Visit (INDEPENDENT_AMBULATORY_CARE_PROVIDER_SITE_OTHER): Payer: Medicare Other | Admitting: Cardiology

## 2015-01-07 ENCOUNTER — Encounter: Payer: Self-pay | Admitting: Cardiology

## 2015-01-07 VITALS — BP 124/62 | HR 72 | Ht 60.0 in | Wt 145.0 lb

## 2015-01-07 DIAGNOSIS — I2510 Atherosclerotic heart disease of native coronary artery without angina pectoris: Secondary | ICD-10-CM | POA: Diagnosis not present

## 2015-01-07 DIAGNOSIS — I2583 Coronary atherosclerosis due to lipid rich plaque: Secondary | ICD-10-CM

## 2015-01-07 DIAGNOSIS — I5041 Acute combined systolic (congestive) and diastolic (congestive) heart failure: Secondary | ICD-10-CM

## 2015-01-07 DIAGNOSIS — E785 Hyperlipidemia, unspecified: Secondary | ICD-10-CM

## 2015-01-07 DIAGNOSIS — I10 Essential (primary) hypertension: Secondary | ICD-10-CM

## 2015-01-07 MED ORDER — FUROSEMIDE 40 MG PO TABS: 40.0000 mg | ORAL_TABLET | Freq: Every day | ORAL | Status: AC

## 2015-01-07 MED ORDER — ISOSORBIDE MONONITRATE ER 30 MG PO TB24: 30.0000 mg | ORAL_TABLET | Freq: Every day | ORAL | Status: AC

## 2015-01-07 MED ORDER — CARVEDILOL 6.25 MG PO TABS: 6.2500 mg | ORAL_TABLET | Freq: Two times a day (BID) | ORAL | Status: AC

## 2015-01-07 NOTE — Assessment & Plan Note (Signed)
-   Continue Plavix and statin 

## 2015-01-07 NOTE — Patient Instructions (Signed)
Medication Instructions:   NO CHANGE  Follow-Up:  Your physician wants you to follow-up in: 3 MONTHS WITH DR CRENSHAW You will receive a reminder letter in the mail two months in advance. If you don't receive a letter, please call our office to schedule the follow-up appointment.      

## 2015-01-07 NOTE — Assessment & Plan Note (Signed)
Blood pressure controlled. Continue present medications. 

## 2015-01-07 NOTE — Assessment & Plan Note (Signed)
Continue statin. 

## 2015-01-07 NOTE — Assessment & Plan Note (Signed)
Patient is much improved after resuming Lasix.Her congestive heart failure/volume overload is likely related to diastolic dysfunction in combination with renal insufficiency. Continue present dose of Lasix. We will need to follow her renal function closely as she has significant renal insufficiency. She is also not a dialysis candidate. Note she is a no CODE BLUE.

## 2015-04-17 ENCOUNTER — Emergency Department (HOSPITAL_COMMUNITY): Payer: Medicare Other

## 2015-04-17 ENCOUNTER — Inpatient Hospital Stay (HOSPITAL_COMMUNITY)
Admission: EM | Admit: 2015-04-17 | Discharge: 2015-05-03 | DRG: 291 | Disposition: E | Payer: Medicare Other | Attending: Internal Medicine | Admitting: Internal Medicine

## 2015-04-17 ENCOUNTER — Encounter (HOSPITAL_COMMUNITY): Payer: Self-pay | Admitting: Emergency Medicine

## 2015-04-17 DIAGNOSIS — R001 Bradycardia, unspecified: Secondary | ICD-10-CM | POA: Diagnosis present

## 2015-04-17 DIAGNOSIS — D631 Anemia in chronic kidney disease: Secondary | ICD-10-CM | POA: Diagnosis present

## 2015-04-17 DIAGNOSIS — N185 Chronic kidney disease, stage 5: Secondary | ICD-10-CM | POA: Diagnosis present

## 2015-04-17 DIAGNOSIS — I509 Heart failure, unspecified: Secondary | ICD-10-CM

## 2015-04-17 DIAGNOSIS — N189 Chronic kidney disease, unspecified: Secondary | ICD-10-CM | POA: Diagnosis not present

## 2015-04-17 DIAGNOSIS — E871 Hypo-osmolality and hyponatremia: Secondary | ICD-10-CM | POA: Diagnosis present

## 2015-04-17 DIAGNOSIS — Z7902 Long term (current) use of antithrombotics/antiplatelets: Secondary | ICD-10-CM | POA: Diagnosis not present

## 2015-04-17 DIAGNOSIS — Z515 Encounter for palliative care: Secondary | ICD-10-CM | POA: Diagnosis present

## 2015-04-17 DIAGNOSIS — R4182 Altered mental status, unspecified: Secondary | ICD-10-CM

## 2015-04-17 DIAGNOSIS — I132 Hypertensive heart and chronic kidney disease with heart failure and with stage 5 chronic kidney disease, or end stage renal disease: Principal | ICD-10-CM | POA: Diagnosis present

## 2015-04-17 DIAGNOSIS — I5041 Acute combined systolic (congestive) and diastolic (congestive) heart failure: Secondary | ICD-10-CM

## 2015-04-17 DIAGNOSIS — Z66 Do not resuscitate: Secondary | ICD-10-CM | POA: Diagnosis present

## 2015-04-17 DIAGNOSIS — E875 Hyperkalemia: Secondary | ICD-10-CM | POA: Diagnosis present

## 2015-04-17 DIAGNOSIS — G931 Anoxic brain damage, not elsewhere classified: Secondary | ICD-10-CM | POA: Diagnosis present

## 2015-04-17 DIAGNOSIS — E1165 Type 2 diabetes mellitus with hyperglycemia: Secondary | ICD-10-CM | POA: Diagnosis present

## 2015-04-17 DIAGNOSIS — E874 Mixed disorder of acid-base balance: Secondary | ICD-10-CM | POA: Diagnosis present

## 2015-04-17 DIAGNOSIS — Z8673 Personal history of transient ischemic attack (TIA), and cerebral infarction without residual deficits: Secondary | ICD-10-CM | POA: Diagnosis not present

## 2015-04-17 DIAGNOSIS — E1122 Type 2 diabetes mellitus with diabetic chronic kidney disease: Secondary | ICD-10-CM | POA: Diagnosis present

## 2015-04-17 DIAGNOSIS — J9601 Acute respiratory failure with hypoxia: Secondary | ICD-10-CM | POA: Diagnosis not present

## 2015-04-17 DIAGNOSIS — Z794 Long term (current) use of insulin: Secondary | ICD-10-CM

## 2015-04-17 DIAGNOSIS — R296 Repeated falls: Secondary | ICD-10-CM | POA: Diagnosis present

## 2015-04-17 DIAGNOSIS — J9602 Acute respiratory failure with hypercapnia: Secondary | ICD-10-CM

## 2015-04-17 DIAGNOSIS — Z87891 Personal history of nicotine dependence: Secondary | ICD-10-CM

## 2015-04-17 DIAGNOSIS — N179 Acute kidney failure, unspecified: Secondary | ICD-10-CM | POA: Diagnosis present

## 2015-04-17 DIAGNOSIS — J9600 Acute respiratory failure, unspecified whether with hypoxia or hypercapnia: Secondary | ICD-10-CM | POA: Diagnosis present

## 2015-04-17 DIAGNOSIS — I5022 Chronic systolic (congestive) heart failure: Secondary | ICD-10-CM | POA: Diagnosis present

## 2015-04-17 DIAGNOSIS — I252 Old myocardial infarction: Secondary | ICD-10-CM | POA: Diagnosis not present

## 2015-04-17 LAB — CBC WITH DIFFERENTIAL/PLATELET
Basophils Absolute: 0 10*3/uL (ref 0.0–0.1)
Basophils Relative: 0 %
EOS ABS: 0.1 10*3/uL (ref 0.0–0.7)
Eosinophils Relative: 1 %
HEMATOCRIT: 20.7 % — AB (ref 36.0–46.0)
HEMOGLOBIN: 6.5 g/dL — AB (ref 12.0–15.0)
LYMPHS ABS: 1 10*3/uL (ref 0.7–4.0)
LYMPHS PCT: 15 %
MCH: 31.7 pg (ref 26.0–34.0)
MCHC: 31.4 g/dL (ref 30.0–36.0)
MCV: 101 fL — AB (ref 78.0–100.0)
Monocytes Absolute: 0.4 10*3/uL (ref 0.1–1.0)
Monocytes Relative: 6 %
NEUTROS ABS: 5.1 10*3/uL (ref 1.7–7.7)
NEUTROS PCT: 78 %
Platelets: 137 10*3/uL — ABNORMAL LOW (ref 150–400)
RBC: 2.05 MIL/uL — AB (ref 3.87–5.11)
RDW: 16 % — ABNORMAL HIGH (ref 11.5–15.5)
WBC: 6.6 10*3/uL (ref 4.0–10.5)

## 2015-04-17 LAB — I-STAT ARTERIAL BLOOD GAS, ED
ACID-BASE DEFICIT: 12 mmol/L — AB (ref 0.0–2.0)
Bicarbonate: 17.6 mEq/L — ABNORMAL LOW (ref 20.0–24.0)
O2 Saturation: 94 %
TCO2: 19 mmol/L (ref 0–100)
pCO2 arterial: 61.1 mmHg (ref 35.0–45.0)
pH, Arterial: 7.068 — CL (ref 7.350–7.450)
pO2, Arterial: 103 mmHg — ABNORMAL HIGH (ref 80.0–100.0)

## 2015-04-17 LAB — I-STAT TROPONIN, ED: TROPONIN I, POC: 0.03 ng/mL (ref 0.00–0.08)

## 2015-04-17 LAB — URINALYSIS, ROUTINE W REFLEX MICROSCOPIC
BILIRUBIN URINE: NEGATIVE
GLUCOSE, UA: NEGATIVE mg/dL
Hgb urine dipstick: NEGATIVE
KETONES UR: NEGATIVE mg/dL
NITRITE: NEGATIVE
Specific Gravity, Urine: 1.015 (ref 1.005–1.030)
pH: 7 (ref 5.0–8.0)

## 2015-04-17 LAB — COMPREHENSIVE METABOLIC PANEL
ALK PHOS: 68 U/L (ref 38–126)
ALT: 42 U/L (ref 14–54)
AST: 57 U/L — ABNORMAL HIGH (ref 15–41)
Albumin: 2.6 g/dL — ABNORMAL LOW (ref 3.5–5.0)
Anion gap: 9 (ref 5–15)
BILIRUBIN TOTAL: 0.3 mg/dL (ref 0.3–1.2)
BUN: 73 mg/dL — ABNORMAL HIGH (ref 6–20)
CO2: 16 mmol/L — AB (ref 22–32)
Calcium: 7.6 mg/dL — ABNORMAL LOW (ref 8.9–10.3)
Chloride: 106 mmol/L (ref 101–111)
Creatinine, Ser: 4.46 mg/dL — ABNORMAL HIGH (ref 0.44–1.00)
GFR calc non Af Amer: 9 mL/min — ABNORMAL LOW (ref >60)
GFR, EST AFRICAN AMERICAN: 10 mL/min — AB (ref >60)
Glucose, Bld: 217 mg/dL — ABNORMAL HIGH (ref 65–99)
Potassium: 5.7 mmol/L — ABNORMAL HIGH (ref 3.5–5.1)
SODIUM: 131 mmol/L — AB (ref 135–145)
TOTAL PROTEIN: 5.8 g/dL — AB (ref 6.5–8.1)

## 2015-04-17 LAB — CBG MONITORING, ED: Glucose-Capillary: 198 mg/dL — ABNORMAL HIGH (ref 65–99)

## 2015-04-17 LAB — URINE MICROSCOPIC-ADD ON: RBC / HPF: NONE SEEN RBC/hpf (ref 0–5)

## 2015-04-17 LAB — BRAIN NATRIURETIC PEPTIDE: B Natriuretic Peptide: 925.3 pg/mL — ABNORMAL HIGH (ref 0.0–100.0)

## 2015-04-17 LAB — PROTIME-INR
INR: 1.37 (ref 0.00–1.49)
PROTHROMBIN TIME: 17 s — AB (ref 11.6–15.2)

## 2015-04-17 LAB — I-STAT CG4 LACTIC ACID, ED: LACTIC ACID, VENOUS: 1 mmol/L (ref 0.5–2.0)

## 2015-04-17 MED ORDER — GLYCOPYRROLATE 0.2 MG/ML IJ SOLN: 0.2000 mg | INTRAMUSCULAR | Status: DC | PRN

## 2015-04-17 MED ORDER — GLYCOPYRROLATE 1 MG PO TABS
1.0000 mg | ORAL_TABLET | ORAL | Status: DC | PRN
  Filled 2015-04-17: qty 1

## 2015-04-17 MED ORDER — MORPHINE SULFATE (PF) 2 MG/ML IV SOLN: 1.0000 mg | INTRAVENOUS | Status: DC

## 2015-04-17 MED ORDER — CETYLPYRIDINIUM CHLORIDE 0.05 % MT LIQD: 7.0000 mL | Freq: Two times a day (BID) | OROMUCOSAL | Status: DC

## 2015-04-17 MED ORDER — NALOXONE HCL 2 MG/2ML IJ SOSY
2.0000 mg | PREFILLED_SYRINGE | Freq: Once | INTRAMUSCULAR | Status: AC
  Administered 2015-04-17: 2 mg via INTRAVENOUS

## 2015-04-17 MED ORDER — MORPHINE SULFATE (PF) 2 MG/ML IV SOLN
1.0000 mg | INTRAVENOUS | Status: DC
  Administered 2015-04-17: 1 mg via INTRAVENOUS
  Filled 2015-04-17: qty 1

## 2015-04-17 MED ORDER — BIOTENE DRY MOUTH MT LIQD: 15.0000 mL | OROMUCOSAL | Status: DC | PRN

## 2015-04-17 MED ORDER — NALOXONE HCL 2 MG/2ML IJ SOSY
PREFILLED_SYRINGE | INTRAMUSCULAR | Status: AC
  Filled 2015-04-17: qty 2

## 2015-04-17 MED ORDER — MORPHINE SULFATE (PF) 4 MG/ML IV SOLN
4.0000 mg | Freq: Once | INTRAVENOUS | Status: AC
  Administered 2015-04-17: 4 mg via INTRAVENOUS
  Filled 2015-04-17: qty 1

## 2015-04-17 MED ORDER — SODIUM CHLORIDE 0.9 % IV BOLUS (SEPSIS)
500.0000 mL | Freq: Once | INTRAVENOUS | Status: AC
  Administered 2015-04-17: 500 mL via INTRAVENOUS

## 2015-04-17 MED ORDER — SCOPOLAMINE 1 MG/3DAYS TD PT72
1.0000 | MEDICATED_PATCH | TRANSDERMAL | Status: DC
  Administered 2015-04-17: 1.5 mg via TRANSDERMAL
  Filled 2015-04-17: qty 1

## 2015-04-17 MED ORDER — DEXTROSE 50 % IV SOLN
1.0000 | Freq: Once | INTRAVENOUS | Status: AC
  Administered 2015-04-17: 50 mL via INTRAVENOUS
  Filled 2015-04-17: qty 50

## 2015-04-17 MED ORDER — CHLORHEXIDINE GLUCONATE 0.12 % MT SOLN: 15.0000 mL | Freq: Two times a day (BID) | OROMUCOSAL | Status: DC

## 2015-04-17 MED ORDER — ONDANSETRON 4 MG PO TBDP: 4.0000 mg | ORAL_TABLET | Freq: Four times a day (QID) | ORAL | Status: DC | PRN

## 2015-04-17 MED ORDER — ACETAMINOPHEN 650 MG RE SUPP: 650.0000 mg | Freq: Four times a day (QID) | RECTAL | Status: DC | PRN

## 2015-04-17 MED ORDER — ACETAMINOPHEN 325 MG PO TABS: 650.0000 mg | ORAL_TABLET | Freq: Four times a day (QID) | ORAL | Status: DC | PRN

## 2015-04-17 MED ORDER — POLYVINYL ALCOHOL 1.4 % OP SOLN
1.0000 [drp] | Freq: Four times a day (QID) | OPHTHALMIC | Status: DC | PRN
  Filled 2015-04-17: qty 15

## 2015-04-17 MED ORDER — INSULIN ASPART 100 UNIT/ML ~~LOC~~ SOLN
10.0000 [IU] | Freq: Once | SUBCUTANEOUS | Status: AC
  Administered 2015-04-17: 10 [IU] via INTRAVENOUS
  Filled 2015-04-17: qty 1

## 2015-04-17 MED ORDER — ONDANSETRON HCL 4 MG/2ML IJ SOLN: 4.0000 mg | Freq: Four times a day (QID) | INTRAMUSCULAR | Status: DC | PRN

## 2015-04-18 LAB — URINE CULTURE: Culture: NO GROWTH

## 2015-04-22 LAB — CULTURE, BLOOD (ROUTINE X 2)
CULTURE: NO GROWTH
CULTURE: NO GROWTH

## 2015-05-03 NOTE — H&P (Signed)
History and Physical  Patient Name: Rachel Curry     ZOX:[id number]    DOB: Feb 18, 1940    DOA: 05-08-15 Referring physician: Ross Marcus, MD PCP: Gaspar Garbe, MD      Chief Complaint: Unresponsiveness  HPI: Rachel Curry is a 75 y.o. female with a past medical history significant for CAD s/p MI in 1989 c/b anoxic brain injury, CHF last EF 40%, IDDM, and CKD stage V not on HD who presents with unresponsiveness.  All history is collected from the patient's family and the patient is unable to respond. She has a history of MI 8 years ago, resulting in anoxic brain injury, after which she has been able to live with her family, complete basic ADLs, but has mental capacity "of a child," per family.  She was admitted twice last fall for acute CHF, had been stabilized on Lasix since, but now needed help with all ADLs.  Now, over the last several days, the patient has become less and less responsive. Tonight the patient's husband was helping her on the commode when she slumped over and was less responsive. There is no preceding cough, fever, urinary incontinence, vomiting that the family were aware of.  In the ED, the patient was initially given CPR, until the husband asked for this to stop. Pulses were faintly palpable by EDP.  She was afebrile, bradycardic, tachypneic, airway secretions, requiring NRB to maintain oxygen saturation.  Labs were significant for hyponatremia, hyperkalemia, AKI, hemoglobin 6.5. PH 7.07, pCO2 60. A bedside ultrasound of the heart showed severely decreased LV function. Insulin D50 were given for hyperkalemia, but then goals of care were discussed with the family, and rather than pursue aggressive work up, transfusions and possible intubation, family elected for comfort cares only, and TRH were asked to evaluate for admission.    Review of Systems:  Unable to obtain due to patient mentation.  Allergies  Allergen Reactions  . Macrodantin [Nitrofurantoin Macrocrystal]  Shortness Of Breath  . Azithromycin Diarrhea    Prior to Admission medications   Medication Sig Start Date End Date Taking? Authorizing Provider  atorvastatin (LIPITOR) 20 MG tablet Take 20 mg by mouth daily. 07/15/14  Yes Historical Provider, MD  carvedilol (COREG) 6.25 MG tablet Take 1 tablet (6.25 mg total) by mouth 2 (two) times daily with a meal. 01/07/15  Yes Lewayne Bunting, MD  cholecalciferol (VITAMIN D) 1000 units tablet Take 2,000 Units by mouth daily.   Yes Historical Provider, MD  clopidogrel (PLAVIX) 75 MG tablet Take 75 mg by mouth daily. 07/15/14  Yes Historical Provider, MD  feeding supplement, GLUCERNA SHAKE, (GLUCERNA SHAKE) LIQD Take 237 mLs by mouth 2 (two) times daily between meals. 12/01/14  Yes Rolly Salter, MD  fenofibrate (TRICOR) 145 MG tablet Take 145 mg by mouth daily. 07/15/14  Yes Historical Provider, MD  furosemide (LASIX) 40 MG tablet Take 1 tablet (40 mg total) by mouth daily. 01/07/15  Yes Lewayne Bunting, MD  insulin aspart (NOVOLOG) 100 UNIT/ML injection 0-15 Units, Subcutaneous, 3 times daily with meals, CBG 70 - 120: 0 units CBG 121 - 150: 2 units CBG 151 - 200: 3 units CBG 201 - 250: 5 units CBG 251 - 300: 8 units CBG 301 - 350: 11 units CBG 351 - 400: 15 units CBG > 400 for 2 reading: call MD 12/01/14  Yes Rolly Salter, MD  insulin glargine (LANTUS) 100 UNIT/ML injection Inject 6-14 Units into the skin at bedtime.    Yes  Historical Provider, MD  insulin lispro protamine-lispro (HUMALOG 75/25 MIX) (75-25) 100 UNIT/ML SUSP injection Inject 20 Units into the skin 3 (three) times daily as needed (low blood sugar after eating).   Yes Historical Provider, MD  isosorbide mononitrate (IMDUR) 30 MG 24 hr tablet Take 1 tablet (30 mg total) by mouth daily. 01/07/15  Yes Lewayne BuntingBrian S Crenshaw, MD  LORazepam (ATIVAN) 1 MG tablet Take 1 mg by mouth 2 (two) times daily as needed. anxiety 08/07/14  Yes Historical Provider, MD  omeprazole (PRILOSEC) 20 MG capsule Take 20 mg by  mouth daily.   Yes Historical Provider, MD  ondansetron (ZOFRAN ODT) 4 MG disintegrating tablet Take 1 tablet (4 mg total) by mouth every 8 (eight) hours as needed for nausea or vomiting. 12/01/14  Yes Rolly SalterPranav M Patel, MD  sulfamethoxazole-trimethoprim (BACTRIM DS,SEPTRA DS) 800-160 MG tablet Take 1 tablet by mouth 3 (three) times a week.   Yes Historical Provider, MD    Past Medical History  Diagnosis Date  . Diabetes mellitus without complication (HCC)   . Hypercholesteremia   . MI, acute, non ST segment elevation (HCC)   . CHF (congestive heart failure) (HCC)   . Anoxic encephalopathy (HCC)   . Renal insufficiency   . Hypertension   . CVA (cerebral infarction)     Past Surgical History  Procedure Laterality Date  . Abdominal hysterectomy      Family history: family history includes Heart disease in her mother.  Social History: Patient lives with her family.  She has been dependent for all ADLs since last fall.  She previously was ambulatory and functional but had cognitive deficits from her MI.  She has four children and a husband.       Physical Exam: BP 108/50 mmHg  Pulse 52  Resp 24  SpO2 93% General appearance: Elderly  female, moribund.   Eyes: Hemorrhage to right eye, scleral swelling.     ENT: No nasal deformity.  Bruising over bridge of nose.  MM dry.   Skin: Mottled, cool. Cardiac: Tachycardic, heart sounds faint.  Veins distended.  LE severe edema. Respiratory: Tachypnea.  Airway coarse sounds, likely secretions and edema. Abdomen: Abdomen without rigidity.  Distended, no involuntary guarding.   MSK: No deformities.  Multiple bruises. Neuro: Unresponsive.  Pupils are sluggishly reactive.  No withdrawal from pain.  No spontaneous movement or vocalizations.  Psych: None      Labs on Admission:  The metabolic panel shows hyponatremia, hyperkalemia, acute on chronic renal failure (baseline GFR 14, now down to 9), hyperglycemia. Albumin 2.6. AST 57. BNP  900. INR normal. Lactate actually normal. The complete blood count shows hemoglobin 6.5.   Radiological Exams on Admission: Personally reviewed: Dg Chest Portable 1 View 2015-04-15   Edema   Ct Head Wo Contrast 2015-04-15  IMPRESSION: No acute intracranial process. Stable moderate to severe global brain atrophy and moderate to severe chronic small vessel ischemic disease. 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   EKG: Independently reviewed. Bradycardic, sinus.     Assessment/Plan 75 year old female with chronic systolic CHF, CKD stage V, IDDM, old anoxic brain injury.  Now with Hgb 6.5 in setting of acidosis, end stage heart failure and renal failure.  Families wishes about resuscitative care have been documented by Cardiology previously, and were expressed to Dr. Wilkie AyeHorton and myself.  They wish for full comfort cares.   Morphine 1 mg IV every 2 hours Scopolamine patch Glycopyrrholate PRN No vital signs Hold oral medications Consult  to Palliative Care, appreciate recommendations    DVT PPx: None, full comfort measures Family Communication: Husband and children at bedside.  Irreversible nature of current illness discussed.  Comfort measures described.    Medical decision making: What exists of the patient's previous chart was reviewed in depth and the case was discussed with Dr. Wilkie Aye. Patient seen 4:54 AM on Apr 24, 2015.  Disposition Plan:  I recommend admission to medical surgical bed, inpatient status, full comfort cares, expect the patient to pass wtihin the next 24 hours.      Alberteen Sam [facility] Pager [phone]

## 2015-05-03 NOTE — ED Notes (Signed)
Dr. Danford at bedside  

## 2015-05-03 NOTE — Discharge Summary (Signed)
Physician Discharge Summary  Patient ID: Rachel Curry MRN: [id number] DOB/AGE: July 19, 1940 75 y.o.  Admit date: 04-30-15 Discharge date: 04/30/2015  Admission Diagnoses: Acute hypoxic respiratory failure from end stage heart failure and renal failure  Discharge Diagnoses:  Principal Problem:   Acute respiratory failure with hypoxia (HCC) Active Problems:   Acute heart failure (HCC)   Anoxic brain damage (HCC)   Acute renal failure (HCC)   Anemia in chronic kidney disease   Acute respiratory failure [facility])   Discharged Condition: Deceased  Hospital Course: The patient was moribund at the time of arrival to the ER, her death was imminent and expected, and she expired shortly after admission.  She was pronounced by nursing at [phone].    Consults: None  Significant Diagnostic Studies: Bedside echocardiogram  Treatments: Comfort cares, morphine, glycopyrrolate, scopolamine  Discharge Exam: Blood pressure 100/45, pulse 50, temperature 98 F (36.7 C), temperature source Axillary, resp. rate 22, height  (1.6 m), weight 70.126 kg (154 lb 9.6 oz), SpO2 100 %. See admission H&P from earlier today.  Disposition: 20-Expired     Medication List at time of death           atorvastatin 20 MG tablet  Commonly known as:  LIPITOR  Take 20 mg by mouth daily.     carvedilol 6.25 MG tablet  Commonly known as:  COREG  Take 1 tablet (6.25 mg total) by mouth 2 (two) times daily with a meal.     cholecalciferol 1000 units tablet  Commonly known as:  VITAMIN D  Take 2,000 Units by mouth daily.     clopidogrel 75 MG tablet  Commonly known as:  PLAVIX  Take 75 mg by mouth daily.     feeding supplement (GLUCERNA SHAKE) Liqd  Take 237 mLs by mouth 2 (two) times daily between meals.     fenofibrate 145 MG tablet  Commonly known as:  TRICOR  Take 145 mg by mouth daily.     furosemide 40 MG tablet  Commonly known as:  LASIX  Take 1 tablet (40 mg total) by mouth daily.     insulin  aspart 100 UNIT/ML injection  Commonly known as:  novoLOG  0-15 Units, Subcutaneous, 3 times daily with meals, CBG 70 - 120: 0 units CBG 121 - 150: 2 units CBG 151 - 200: 3 units CBG 201 - 250: 5 units CBG 251 - 300: 8 units CBG 301 - 350: 11 units CBG 351 - 400: 15 units CBG > 400 for 2 reading: call MD     insulin glargine 100 UNIT/ML injection  Commonly known as:  LANTUS  Inject 6-14 Units into the skin at bedtime.     insulin lispro protamine-lispro (75-25) 100 UNIT/ML Susp injection  Commonly known as:  HUMALOG 75/25 MIX  Inject 20 Units into the skin 3 (three) times daily as needed (low blood sugar after eating).     isosorbide mononitrate 30 MG 24 hr tablet  Commonly known as:  IMDUR  Take 1 tablet (30 mg total) by mouth daily.     LORazepam 1 MG tablet  Commonly known as:  ATIVAN  Take 1 mg by mouth 2 (two) times daily as needed. anxiety     omeprazole 20 MG capsule  Commonly known as:  PRILOSEC  Take 20 mg by mouth daily.     ondansetron 4 MG disintegrating tablet  Commonly known as:  ZOFRAN ODT  Take 1 tablet (4 mg total) by mouth every 8 (eight)  hours as needed for nausea or vomiting.     sulfamethoxazole-trimethoprim 800-160 MG tablet  Commonly known as:  BACTRIM DS,SEPTRA DS  Take 1 tablet by mouth 3 (three) times a week.         SignedAlberteen Sam: Dianey Suchy P Calypso Hagarty 04/30/2015, 12:33 PM

## 2015-05-03 NOTE — Code Documentation (Signed)
Compressions discontinued at this time per husband.

## 2015-05-03 NOTE — Progress Notes (Signed)
New admit from ER , DNR flat affect, flaccid  with  Non rebreather mask, with family at the bedside, I observed pt was  pulseless, no breathing noted pt died at 0625, pronounced by 2 nurses  on call MD informed, husband at the bedside, charge nurse call the [place] donor, waiting for other family member to come.

## 2015-05-03 NOTE — Code Documentation (Signed)
Dr Wilkie AyeHorton at bedside with ultrasound.

## 2015-05-03 NOTE — Progress Notes (Signed)
   08-07-2015 0300  Clinical Encounter Type  Visited With Patient and family together;Health care provider  Visit Type Critical Care;ED  Referral From Nurse  Consult/Referral To Chaplain;Palliative care;Faith community  Spiritual Encounters  Spiritual Needs Emotional  Stress Factors  Patient Stress Factors None identified  Family Stress Factors Exhausted;Loss of control   Chaplain was called to support Family in the Ed. Chaplain provided emotional care via prayer.

## 2015-05-03 NOTE — ED Notes (Signed)
Patient arrived via POV, unresponsive in wheelchair.  Patient is pale, unresponsive.  Patient has multiple bruising on face due to falls.  Patient is with family.

## 2015-05-03 NOTE — ED Provider Notes (Signed)
CSN: [id number]     Arrival date & time 04/28/15  0202 History  By signing my name below, I, Doreatha Martin, attest that this documentation has been prepared under the direction and in the presence of Shon Baton, MD. Electronically Signed: Doreatha Martin, ED Scribe. 2015/04/28. 2:28 AM.     Chief Complaint  Patient presents with  . Altered Mental Status   LEVEL 5 CAVEAT: HPI and ROS limited due to acuity of condition    The history is provided by the spouse. The history is limited by the condition of the patient. No language interpreter was used.   HPI Comments: Rachel Curry is a 75 y.o. female with h/o DM, MI, CHF, HTN, CVA who presents to the Emergency Department d/t generalized weakness and AMS tonight. Husband states the pt suddenly became weak and confused as she was getting in the bed tonight. He states she is chronically ill and her condition has declined for the past 2-3 weeks. Denies any significant one-sided weakness. He states at her recent baseline, she has not been very verbal or independent. Denies recent fevers. Husband reports a fall from the commode onto her face 2 weeks ago. She was not evaluated immediately following this fall and did not receive head CT. She was last seen by her PCP 5 days ago. Pt is on qd Plavix.   Past Medical History  Diagnosis Date  . Diabetes mellitus without complication (HCC)   . Hypercholesteremia   . MI, acute, non ST segment elevation (HCC)   . CHF (congestive heart failure) (HCC)   . Anoxic encephalopathy (HCC)   . Renal insufficiency   . Hypertension   . CVA (cerebral infarction)    Past Surgical History  Procedure Laterality Date  . Abdominal hysterectomy     Family History  Problem Relation Age of Onset  . Heart disease Mother    Social History  Substance Use Topics  . Smoking status: Former Games developer  . Smokeless tobacco: None  . Alcohol Use: No   OB History    No data available     Review of Systems  Unable to perform ROS:  Acuity of condition   Allergies  Macrodantin and Azithromycin  Home Medications   Prior to Admission medications   Medication Sig Start Date End Date Taking? Authorizing Provider  atorvastatin (LIPITOR) 20 MG tablet Take 20 mg by mouth daily. 07/15/14   Historical Provider, MD  carvedilol (COREG) 6.25 MG tablet Take 1 tablet (6.25 mg total) by mouth 2 (two) times daily with a meal. 01/07/15   Lewayne Bunting, MD  clopidogrel (PLAVIX) 75 MG tablet Take 75 mg by mouth daily. 07/15/14   Historical Provider, MD  feeding supplement, GLUCERNA SHAKE, (GLUCERNA SHAKE) LIQD Take 237 mLs by mouth 2 (two) times daily between meals. 12/01/14   Rolly Salter, MD  fenofibrate (TRICOR) 145 MG tablet Take 145 mg by mouth daily. 07/15/14   Historical Provider, MD  furosemide (LASIX) 40 MG tablet Take 1 tablet (40 mg total) by mouth daily. 01/07/15   Lewayne Bunting, MD  insulin aspart (NOVOLOG) 100 UNIT/ML injection 0-15 Units, Subcutaneous, 3 times daily with meals, CBG 70 - 120: 0 units CBG 121 - 150: 2 units CBG 151 - 200: 3 units CBG 201 - 250: 5 units CBG 251 - 300: 8 units CBG 301 - 350: 11 units CBG 351 - 400: 15 units CBG > 400 for 2 reading: call MD 12/01/14   Rolly Salter,  MD  insulin glargine (LANTUS) 100 UNIT/ML injection Inject 14 Units into the skin at bedtime.    Historical Provider, MD  isosorbide mononitrate (IMDUR) 30 MG 24 hr tablet Take 1 tablet (30 mg total) by mouth daily. 01/07/15   Lewayne Bunting, MD  LORazepam (ATIVAN) 1 MG tablet Take 1 mg by mouth 2 (two) times daily as needed. anxiety 08/07/14   Historical Provider, MD  omeprazole (PRILOSEC) 20 MG capsule Take 20 mg by mouth daily.    Historical Provider, MD  ondansetron (ZOFRAN ODT) 4 MG disintegrating tablet Take 1 tablet (4 mg total) by mouth every 8 (eight) hours as needed for nausea or vomiting. 12/01/14   Rolly Salter, MD   BP 120/44 mmHg  Pulse 48  Resp 19  SpO2 100% Physical Exam  Constitutional:  Chronically  ill-appearing, pale, unresponsive  HENT:  Head: Normocephalic.  Bruising noted over the 4 head and bilateral orbits Mucous membranes dry  Eyes: Pupils are equal, round, and reactive to light.  Pupils 2 mm minimally reactive bilaterally Large subconjunctival hemorrhage on the right  Cardiovascular: Regular rhythm.   Bradycardia, faint heart sounds  Pulmonary/Chest: She is in respiratory distress. She has no wheezes. She has rales.  Agonal and gurgling respirations  Abdominal: Soft. Bowel sounds are normal. There is no tenderness. There is no rebound.  Musculoskeletal: She exhibits edema.  2+ bilateral lower extremity edema  Neurological:  Lethargic, unresponsive  Skin: Skin is warm and dry.  Bruising noted over the patient's left breast, left arm, left side of his face as noted above  Psychiatric: She has a normal mood and affect.  Nursing note and vitals reviewed.   ED Course  Procedures (including critical care time)  CRITICAL CARE Performed by: Shon Baton   Total critical care time: 30 minutes  Critical care time was exclusive of separately billable procedures and treating other patients.  Critical care was necessary to treat or prevent imminent or life-threatening deterioration.  Critical care was time spent personally by me on the following activities: development of treatment plan with patient and/or surrogate as well as nursing, discussions with consultants, evaluation of patient's response to treatment, examination of patient, obtaining history from patient or surrogate, ordering and performing treatments and interventions, ordering and review of laboratory studies, ordering and review of radiographic studies, pulse oximetry and re-evaluation of patient's condition.    EMERGENCY DEPARTMENT [place] CARDIAC EXAM "Study: Limited Ultrasound of the heart and pericardium"  INDICATIONS:Dyspnea Multiple views of the heart and pericardium are obtained with a multi-frequency  probe.  PERFORMED ZO:XWRUEA  IMAGES ARCHIVED?: Yes  FINDINGS: Decreased contractility  LIMITATIONS:  Body habitus and Emergent procedure  VIEWS USED: Parasternal long axis  INTERPRETATION: Decreased contractility  COMMENT:  Evidence of decreased EF   DIAGNOSTIC STUDIES: Oxygen Saturation is 90% on NRB, low by my interpretation.    COORDINATION OF CARE: 2:24 AM Discussed treatment plan with husband at bedside which includes lab work and he agreed to plan.   Labs Review Labs Reviewed  CBC WITH DIFFERENTIAL/PLATELET - Abnormal; Notable for the following:    RBC 2.05 (*)    Hemoglobin 6.5 (*)    HCT 20.7 (*)    MCV 101.0 (*)    RDW 16.0 (*)    Platelets 137 (*)    All other components within normal limits  COMPREHENSIVE METABOLIC PANEL - Abnormal; Notable for the following:    Sodium 131 (*)    Potassium 5.7 (*)  CO2 16 (*)    Glucose, Bld 217 (*)    BUN 73 (*)    Creatinine, Ser 4.46 (*)    Calcium 7.6 (*)    Total Protein 5.8 (*)    Albumin 2.6 (*)    AST 57 (*)    GFR calc non Af Amer 9 (*)    GFR calc Af Amer 10 (*)    All other components within normal limits  URINALYSIS, ROUTINE W REFLEX MICROSCOPIC (NOT AT [facility]) - Abnormal; Notable for the following:    APPearance CLOUDY (*)    Protein, ur >300 (*)    Leukocytes, UA SMALL (*)    All other components within normal limits  PROTIME-INR - Abnormal; Notable for the following:    Prothrombin Time 17.0 (*)    All other components within normal limits  BRAIN NATRIURETIC PEPTIDE - Abnormal; Notable for the following:    B Natriuretic Peptide 925.3 (*)    All other components within normal limits  URINE MICROSCOPIC-ADD ON - Abnormal; Notable for the following:    Squamous Epithelial / LPF 0-5 (*)    Bacteria, UA MANY (*)    Casts HYALINE CASTS (*)    All other components within normal limits  CBG MONITORING, ED - Abnormal; Notable for the following:    Glucose-Capillary 198 (*)    All other components  within normal limits  I-STAT ARTERIAL BLOOD GAS, ED - Abnormal; Notable for the following:    pH, Arterial 7.068 (*)    pCO2 arterial 61.1 (*)    pO2, Arterial 103.0 (*)    Bicarbonate 17.6 (*)    Acid-base deficit 12.0 (*)    All other components within normal limits  CULTURE, BLOOD (ROUTINE X 2)  CULTURE, BLOOD (ROUTINE X 2)  URINE CULTURE  I-STAT CG4 LACTIC ACID, ED  I-STAT TROPOININ, ED    Imaging Review Ct Head Wo Contrast  2015-04-19  CLINICAL DATA:  Generalized weakness and altered mental status tonight. Chronically ill, declining for 2-3 weeks. History of diabetes, CHF, hypertension, stroke. EXAM: CT HEAD WITHOUT CONTRAST TECHNIQUE: Contiguous axial images were obtained from the base of the skull through the vertex without intravenous contrast. COMPARISON:  CT head October 06, 2014 FINDINGS: INTRACRANIAL CONTENTS: No intraparenchymal hemorrhage, mass effect, midline shift or acute large vascular territory infarcts. Fannie Knee RIGHT frontal convexity is encephalomalacia. Moderate to severe ventriculomegaly on the basis of global parenchymal brain volume loss. Old LEFT corona radiata infarct extending to the internal capsule. Fall bilateral basal ganglia lacunar infarcts. Patchy to confluent supratentorial white matter hypodensities are similar. No abnormal extra-axial fluid collections. Basal cisterns are patent. Severe calcific atherosclerosis of the carotid siphons and included vertebral arteries. ORBITS: The included ocular globes and orbital contents are non-suspicious. Status post bilateral ocular lens implants. SINUSES: Mild to moderate paranasal sinus mucosal thickening. No air-fluid levels. Trace LEFT mastoid effusion. Soft tissue within the LEFT external auditory canal likely represents cerumen. SKULL/SOFT TISSUES: No skull fracture. No significant soft tissue swelling. IMPRESSION: No acute intracranial process. Stable moderate to severe global brain atrophy and moderate to severe chronic  small vessel ischemic disease. 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 Electronically Signed   By: Awilda Metro M.D.   On: 2015-04-19 03:49   Dg Chest Portable 1 View  04-19-15  CLINICAL DATA:  Acute onset of shortness of breath and altered mental status. Initial encounter. EXAM: PORTABLE CHEST 1 VIEW COMPARISON:  Chest radiograph performed 11/27/2014 FINDINGS: The lungs are well-aerated. Vascular congestion  is noted. Mildly increased interstitial markings raise concern for mild interstitial edema. The right costophrenic angle is incompletely imaged on this study. No pleural effusion or pneumothorax is seen. The cardiomediastinal silhouette is mildly enlarged. No acute osseous abnormalities are seen. External pacing pads are noted. IMPRESSION: Vascular congestion and mild cardiomegaly. Mildly increased interstitial markings raise concern for mild interstitial edema. Electronically Signed   By: Roanna RaiderJeffery  Chang M.D.   On: 11/18/15 03:29   I have personally reviewed and evaluated these images and lab results as part of my medical decision-making.   EKG Interpretation   Date/Time:  Saturday April 17 2015 02:30:04 EDT Ventricular Rate:  50 PR Interval:  200 QRS Duration: 114 QT Interval:  516 QTC Calculation: 470 R Axis:   -3 Text Interpretation:  Sinus bradycardia Low voltage QRS Inferior infarct ,  age undetermined Abnormal ECG Confirmed by Joaquin Knebel  MD, Neziah Vogelgesang ([id number]) on  08-27-2015 2:42:52 AM      MDM   Final diagnoses:  End of life care  Altered mental status, unspecified altered mental status type    Patient's husband is at the bedside and gives history. Patient is chronically ill but has had recent worsening decline over the last several weeks. Multiple falls at home. She is on Plavix. She has evidence of bruising. She is currently unresponsive and bradycardic. Initially, no pulse was palpated. CPR was initiated. Discussed at length with this husband what the patient's wishes  were. Reports that she previously been DO NOT RESUSCITATE/DO NOT INTUBATE. Husband reports that "she would not want to live like this."  After stopping CPR, very pain pulse was noted. Ultrasound showed some squeeze of the heart. Reduced EF. She appears volume overloaded. EKG shows sinus bradycardia. Discussed with the husband making the patient comfortable and reversing any reversible conditions. It appears the patient is at the end of her life. Patient was given a scopolamine patch for secretions.  4:05 AM Patient with evidence of acute kidney injury and mild hyperkalemia also with anemia. Husband denies any bloody stools. Does report a bleeding hemorrhoid recently but none of the last several days. Patient is acidotic likely combined respiratory and metabolic.  Volume overloaded. Discussed with the husband goals of care. She does appear to be volume overloaded but also has acute kidney injury with mild hyperkalemia and anemia. Given her quality of life, discussed with the husband's goals of care. He would like to make her comfortable. At this time we'll hold off on any blood transfusions or further workup. Patient was made comfort care only. She was given morphine and Robinul.  I personally performed the services described in this documentation, which was scribed in my presence. The recorded information has been reviewed and is accurate.   Shon Batonourtney F Merit Gadsby, MD October 08, 2015 [phone]

## 2015-05-03 DEATH — deceased

## 2017-02-11 IMAGING — CR DG CHEST 1V PORT
1 series · 1 of 1 positions shown · non-contrast
Comparison: 10/04/2014

CLINICAL DATA: Acute cardiac failure

EXAM:
PORTABLE CHEST - 1 VIEW

[AP]
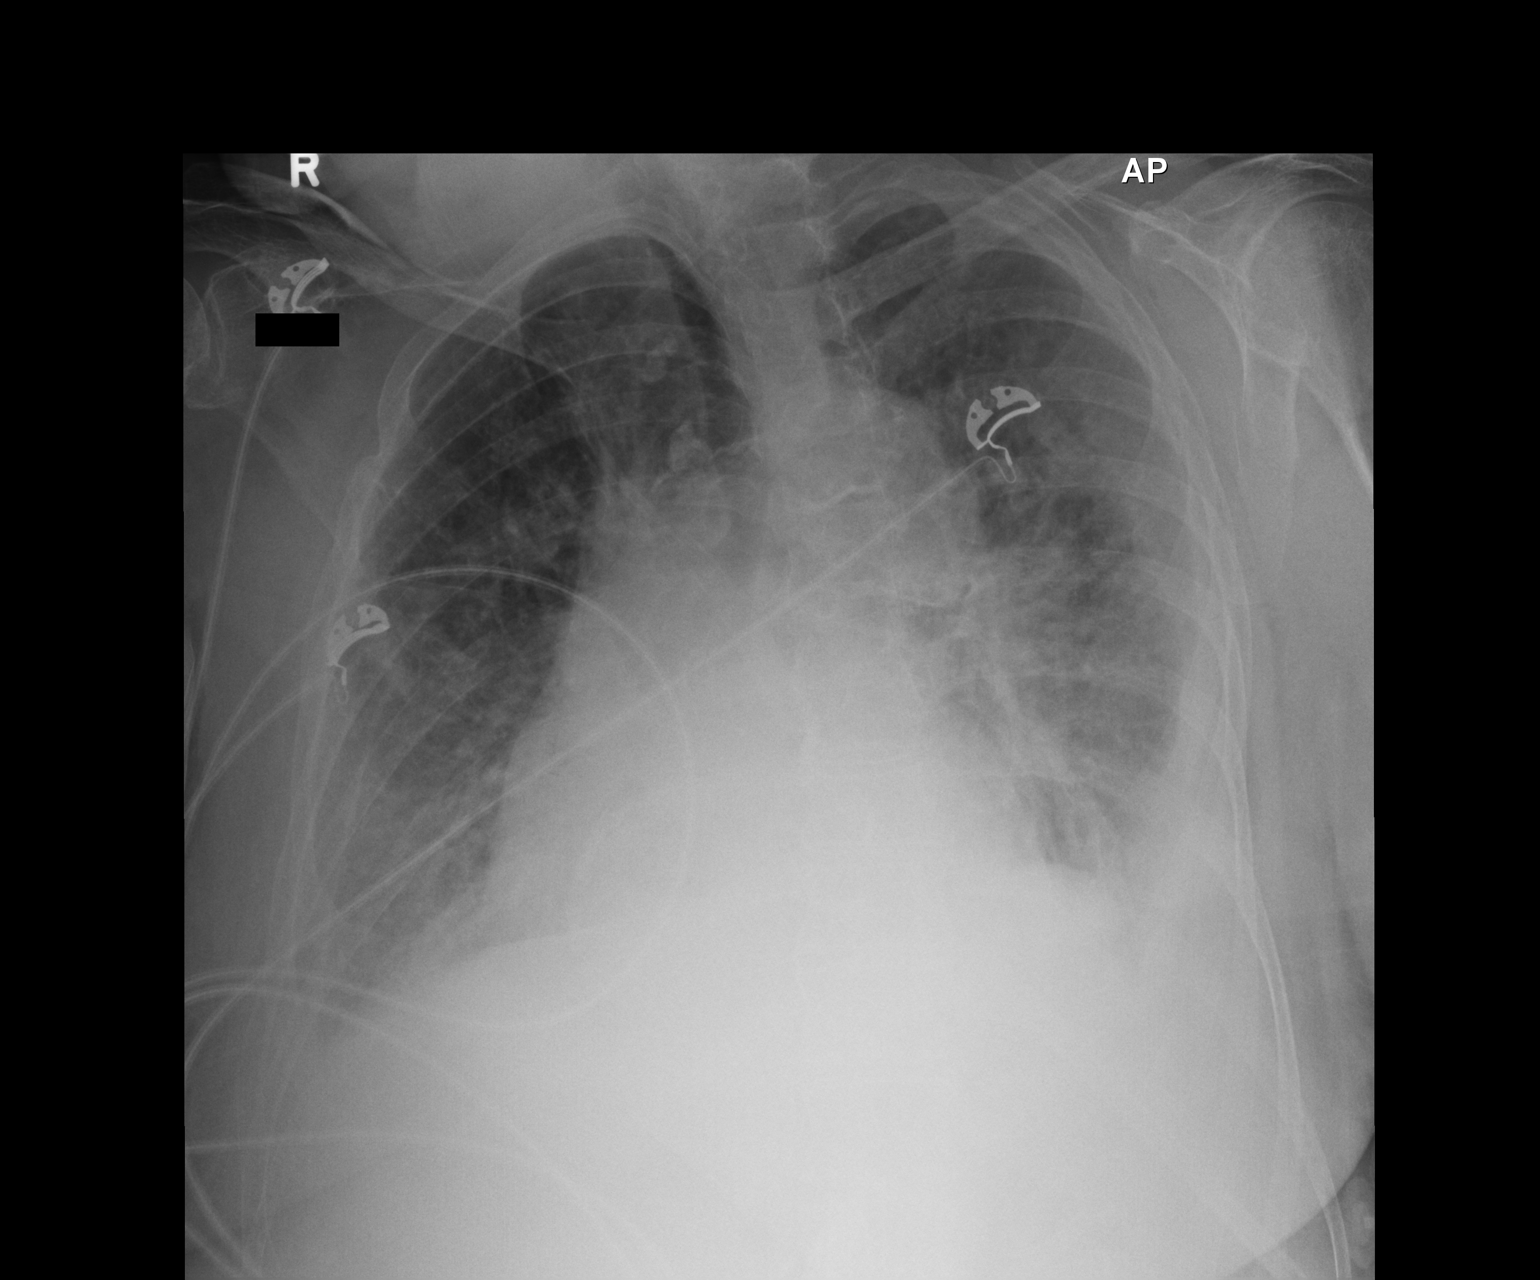

[1 of 1 positions shown; findings below may reference images not displayed]

FINDINGS: Cardiac shadow is stable but mildly enlarged. Persistent left-sided
pleural effusion is noted. Small right-sided pleural effusion is
noted as well. Vascular congestion is again noted and stable. No new
focal infiltrate is seen.
IMPRESSION: Stable bilateral effusions left greater than right and stable
vascular congestion consistent with congestive failure.

## 2017-08-24 IMAGING — DX DG CHEST 1V PORT
1 series · 1 of 1 positions shown · non-contrast
Comparison: Chest radiograph performed 11/27/2014

CLINICAL DATA: Acute onset of shortness of breath and altered
mental status. Initial encounter.

EXAM:
PORTABLE CHEST 1 VIEW

[chest ap]
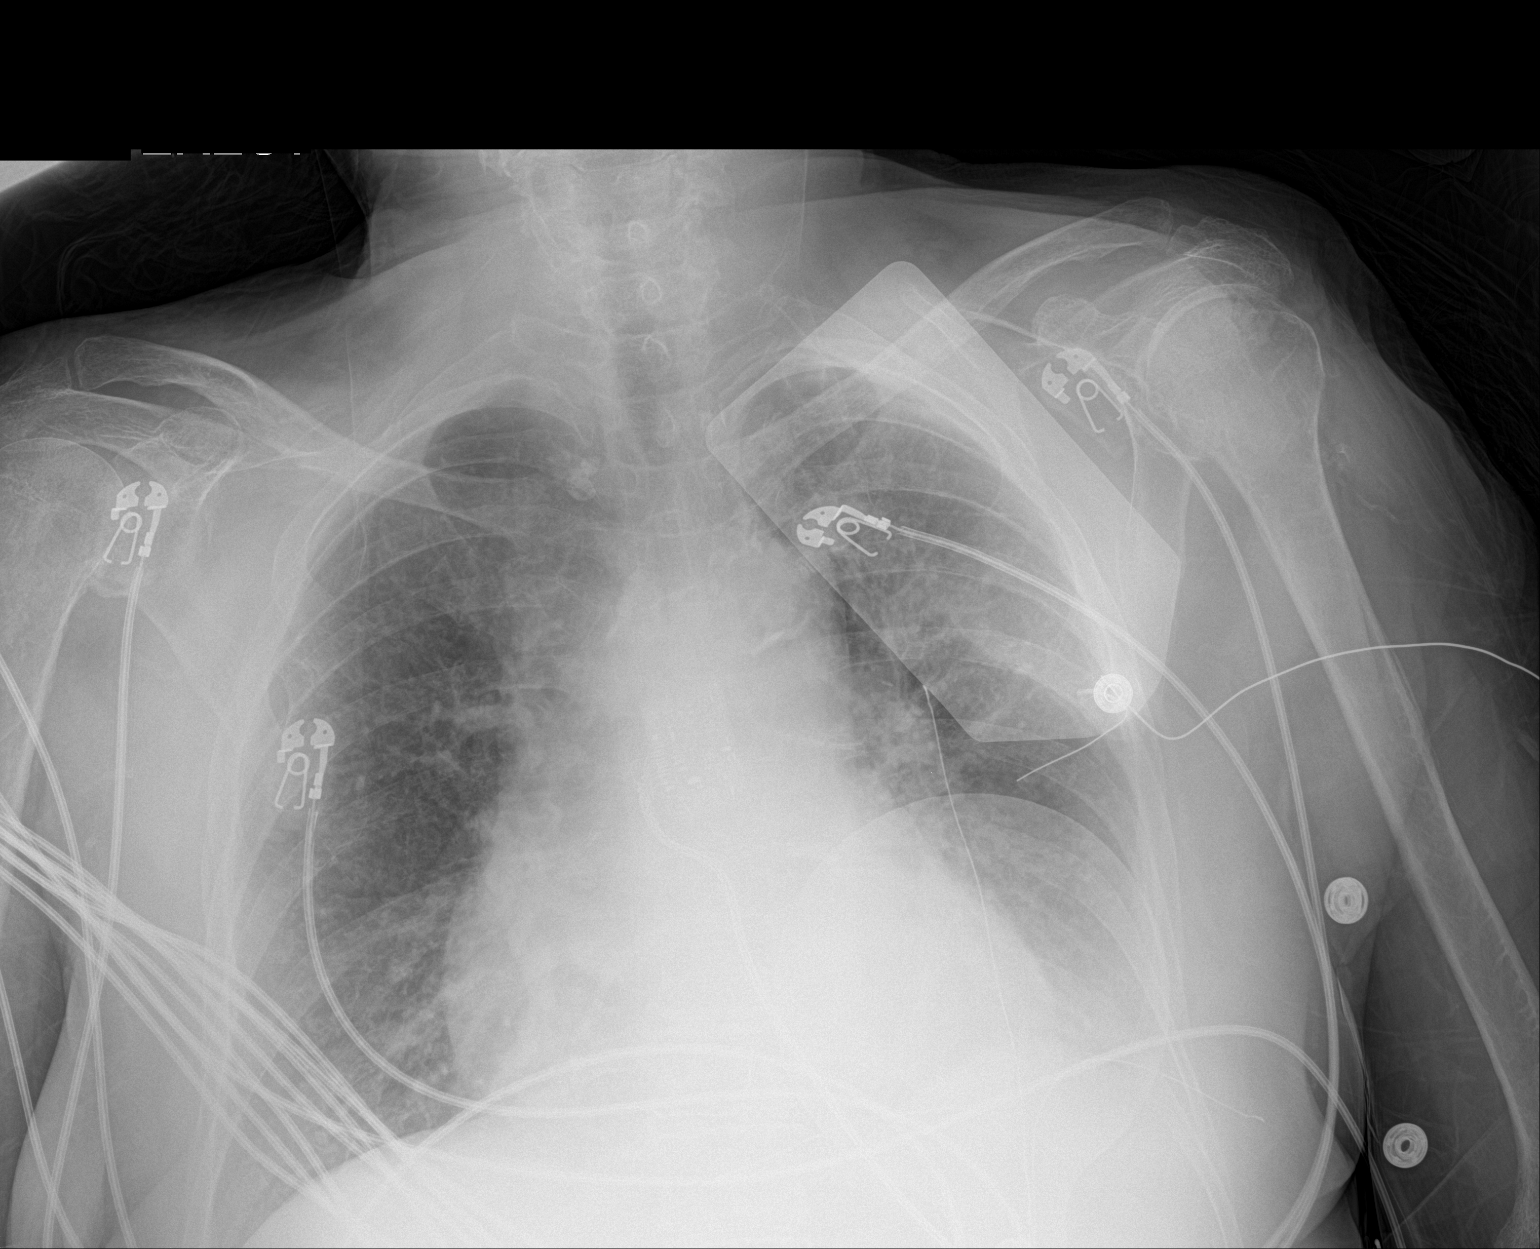

[1 of 1 positions shown; findings below may reference images not displayed]

FINDINGS: The lungs are well-aerated. Vascular congestion is noted. Mildly
increased interstitial markings raise concern for mild interstitial
edema. The right costophrenic angle is incompletely imaged on this
study. No pleural effusion or pneumothorax is seen.

The cardiomediastinal silhouette is mildly enlarged. No acute
osseous abnormalities are seen. External pacing pads are noted.
IMPRESSION: Vascular congestion and mild cardiomegaly. Mildly increased
interstitial markings raise concern for mild interstitial edema.
